# Patient Record
Sex: Female | Born: 1960 | Race: White | Hispanic: No | Marital: Married | Smoking: Never smoker
Health system: Southern US, Community
[De-identification: ages and names within clinical notes are randomized; demographics above are authoritative.]

## PROBLEM LIST (undated history)

## (undated) DIAGNOSIS — C801 Malignant (primary) neoplasm, unspecified: Secondary | ICD-10-CM

## (undated) DIAGNOSIS — R140 Abdominal distension (gaseous): Secondary | ICD-10-CM

## (undated) DIAGNOSIS — E079 Disorder of thyroid, unspecified: Secondary | ICD-10-CM

## (undated) DIAGNOSIS — K5090 Crohn's disease, unspecified, without complications: Secondary | ICD-10-CM

## (undated) DIAGNOSIS — G43909 Migraine, unspecified, not intractable, without status migrainosus: Secondary | ICD-10-CM

## (undated) HISTORY — DX: Crohn's disease, unspecified, without complications: K50.90

## (undated) HISTORY — PX: BREAST SURGERY: SHX581

## (undated) HISTORY — DX: Migraine, unspecified, not intractable, without status migrainosus: G43.909

## (undated) HISTORY — DX: Malignant (primary) neoplasm, unspecified: C80.1

## (undated) HISTORY — DX: Abdominal distension (gaseous): R14.0

## (undated) HISTORY — PX: OTHER SURGICAL HISTORY: SHX169

## (undated) HISTORY — DX: Disorder of thyroid, unspecified: E07.9

## (undated) HISTORY — PX: COLON SURGERY: SHX602

---

## 1992-11-06 HISTORY — PX: THYROIDECTOMY, PARTIAL: SHX18

## 1998-02-06 ENCOUNTER — Ambulatory Visit (HOSPITAL_COMMUNITY): Admission: RE | Admit: 1998-02-06 | Discharge: 1998-02-06 | Payer: Self-pay | Admitting: Gastroenterology

## 1998-12-08 ENCOUNTER — Other Ambulatory Visit: Admission: RE | Admit: 1998-12-08 | Discharge: 1998-12-08 | Payer: Self-pay | Admitting: Obstetrics and Gynecology

## 1999-09-10 ENCOUNTER — Ambulatory Visit (HOSPITAL_COMMUNITY): Admission: RE | Admit: 1999-09-10 | Discharge: 1999-09-10 | Payer: Self-pay | Admitting: Internal Medicine

## 1999-09-12 ENCOUNTER — Encounter (HOSPITAL_BASED_OUTPATIENT_CLINIC_OR_DEPARTMENT_OTHER): Payer: Self-pay | Admitting: Internal Medicine

## 1999-09-12 ENCOUNTER — Ambulatory Visit (HOSPITAL_COMMUNITY): Admission: RE | Admit: 1999-09-12 | Discharge: 1999-09-12 | Payer: Self-pay | Admitting: Internal Medicine

## 2000-10-22 ENCOUNTER — Other Ambulatory Visit: Admission: RE | Admit: 2000-10-22 | Discharge: 2000-10-22 | Payer: Self-pay | Admitting: Obstetrics and Gynecology

## 2001-06-22 ENCOUNTER — Inpatient Hospital Stay (HOSPITAL_COMMUNITY): Admission: AD | Admit: 2001-06-22 | Discharge: 2001-06-24 | Payer: Self-pay | Admitting: Obstetrics and Gynecology

## 2002-03-18 ENCOUNTER — Encounter: Admission: RE | Admit: 2002-03-18 | Discharge: 2002-06-16 | Payer: Self-pay | Admitting: Family Medicine

## 2003-06-29 ENCOUNTER — Other Ambulatory Visit: Admission: RE | Admit: 2003-06-29 | Discharge: 2003-06-29 | Payer: Self-pay | Admitting: Family Medicine

## 2004-09-28 ENCOUNTER — Other Ambulatory Visit: Admission: RE | Admit: 2004-09-28 | Discharge: 2004-09-28 | Payer: Self-pay | Admitting: Obstetrics and Gynecology

## 2006-06-23 ENCOUNTER — Encounter: Admission: RE | Admit: 2006-06-23 | Discharge: 2006-06-23 | Payer: Self-pay | Admitting: Obstetrics and Gynecology

## 2006-07-07 ENCOUNTER — Encounter: Admission: RE | Admit: 2006-07-07 | Discharge: 2006-07-07 | Payer: Self-pay | Admitting: Surgery

## 2006-07-07 ENCOUNTER — Ambulatory Visit (HOSPITAL_BASED_OUTPATIENT_CLINIC_OR_DEPARTMENT_OTHER): Admission: RE | Admit: 2006-07-07 | Discharge: 2006-07-07 | Payer: Self-pay | Admitting: Surgery

## 2006-07-07 ENCOUNTER — Encounter (INDEPENDENT_AMBULATORY_CARE_PROVIDER_SITE_OTHER): Payer: Self-pay | Admitting: *Deleted

## 2006-12-18 ENCOUNTER — Ambulatory Visit: Payer: Self-pay | Admitting: Psychology

## 2007-01-01 ENCOUNTER — Ambulatory Visit: Payer: Self-pay | Admitting: Psychology

## 2007-01-22 ENCOUNTER — Ambulatory Visit: Payer: Self-pay | Admitting: Psychology

## 2007-02-04 ENCOUNTER — Ambulatory Visit: Payer: Self-pay | Admitting: Psychology

## 2007-02-17 ENCOUNTER — Ambulatory Visit: Payer: Self-pay | Admitting: Psychology

## 2007-03-02 ENCOUNTER — Ambulatory Visit: Payer: Self-pay | Admitting: Psychology

## 2007-03-24 ENCOUNTER — Ambulatory Visit: Payer: Self-pay | Admitting: Psychology

## 2007-04-16 ENCOUNTER — Ambulatory Visit: Payer: Self-pay | Admitting: Psychology

## 2007-04-30 ENCOUNTER — Ambulatory Visit: Payer: Self-pay | Admitting: Psychology

## 2007-05-15 ENCOUNTER — Ambulatory Visit: Payer: Self-pay | Admitting: Psychology

## 2007-06-30 ENCOUNTER — Ambulatory Visit: Payer: Self-pay | Admitting: Psychology

## 2007-08-06 ENCOUNTER — Encounter: Admission: RE | Admit: 2007-08-06 | Discharge: 2007-08-06 | Payer: Self-pay | Admitting: Obstetrics and Gynecology

## 2007-08-07 ENCOUNTER — Ambulatory Visit: Payer: Self-pay | Admitting: Psychology

## 2007-09-08 ENCOUNTER — Ambulatory Visit: Payer: Self-pay | Admitting: Psychology

## 2007-10-20 ENCOUNTER — Ambulatory Visit: Payer: Self-pay | Admitting: Psychology

## 2007-11-03 ENCOUNTER — Ambulatory Visit: Payer: Self-pay | Admitting: Psychology

## 2007-12-15 ENCOUNTER — Ambulatory Visit: Payer: Self-pay | Admitting: Psychology

## 2008-01-12 ENCOUNTER — Ambulatory Visit: Payer: Self-pay | Admitting: Psychology

## 2008-02-23 ENCOUNTER — Ambulatory Visit: Payer: Self-pay | Admitting: Psychology

## 2008-05-02 ENCOUNTER — Ambulatory Visit: Payer: Self-pay | Admitting: Psychology

## 2008-05-31 ENCOUNTER — Ambulatory Visit: Payer: Self-pay | Admitting: Psychology

## 2009-01-05 ENCOUNTER — Encounter: Admission: RE | Admit: 2009-01-05 | Discharge: 2009-01-05 | Payer: Self-pay | Admitting: Obstetrics and Gynecology

## 2010-05-29 ENCOUNTER — Other Ambulatory Visit: Payer: Self-pay | Admitting: Dermatology

## 2010-08-24 NOTE — Op Note (Signed)
Jody Chen, DONN NO.:  [id number]   MEDICAL RECORD NO.:  [id number]          PATIENT TYPE:  AMB   LOCATION:  [place]                          FACILITY:  [facility]   PHYSICIAN:  Haywood Lasso, M.D.DATE OF BIRTH:  1960-06-21   DATE OF PROCEDURE:  07/07/2006  DATE OF DISCHARGE:                               OPERATIVE REPORT   [facility] OFFICE MEDICAL NO.:  [id number].   PREOPERATIVE DIAGNOSIS:  Right breast mass.   POSTOPERATIVE DIAGNOSIS:  Right breast mass.   OPERATION:  Needle-guided excision of right breast mass.   SURGEON:  Haywood Lasso, M.D.   ANESTHESIA:  MAC.   CLINICAL HISTORY:  This is a 50 year old lady who recently had a  mammogram that showed a small mass in her right breast.  It was  subareolar and may have been a papilloma inside a dilated milk duct.  It  was not palpable.  After discussion of the alternatives, she wished to  have an excisional biopsy.   DESCRIPTION OF PROCEDURE:  The patient was seen in the holding area and  had no further questions.  The right breast was marked as the operative  side.  I reviewed the needle localized mammogram film, as well as  ultrasound film.  The guidewire entered the breast medially and tracked  laterally, and was about a centimeter below the inferior edge of the  areolar margin.  An X just below the areolar margin marked the skin at  the location of the nodule.   DESCRIPTION OF PROCEDURE:  The patient was taken to the operating room,  and after IV sedation had been obtained the breast was prepped and  draped.  The time-out occurred.   A combination 1% Xylocaine plain and 0.5% Marcaine with epinephrine was  mixed equally and used for local.  Because she has implants, I was  careful to infiltrate all of the skin and subcutaneous tissue -- staying  very superficial.   I made a curvilinear incision at the areolar edge, going from about the  3 o'clock to the 6 o'clock position, since the guidewire  was coming in  fairly medially.  Using some skin hooks, I elevated the skin flap until  I could get over to the guidewire and manipulated the guidewire into the  incision.  I then used the cautery to take a cylinder of tissue around  the guidewire, starting medially and working laterally in the direction  it was moving.  I took only about a centimeter diameter piece of tissue,  since this nodule was fairly small; the guidewire appeared to go  directly through it.  This got a little more medial, the guidewire was  actually tracking a little bit superior; and although the X was a little  bit below the areolar margin, I could identify dilated duct which was  consistent with a nodule we had anticipated.  This was really at about  the 5:30 position at the bed about the areolar edge.  I took this out  intact, and we appeared to have a dilated duct.  A couple of other  ducts  with some fibrocystic type material was noted, but nothing that looked  like DCIS.   This was sent for specimen ultrasound.  I made sure everything was dry  and then infiltrated a little bit more Marcaine local; again, staying  superficial to avoid injury to the implant.   The incision was closed with some 3-0 Vicryl, followed by 4-0 Monocryl  subcuticular and Dermabond.   The patient tolerated the procedure well.  There were no operative  complications.  All counts were correct.  Dr. Sadie Haber reported that the  ultrasound showed that the specimen contained the lesion in question.      Haywood Lasso, M.D.  Electronically Signed     CJS/MEDQ  D:  07/07/2006  T:  07/07/2006  Job:  [id number]   cc:   Suszanne Conners, M.D.

## 2011-01-15 ENCOUNTER — Other Ambulatory Visit: Payer: Self-pay | Admitting: Obstetrics and Gynecology

## 2011-01-18 ENCOUNTER — Other Ambulatory Visit: Payer: Self-pay | Admitting: Obstetrics and Gynecology

## 2011-01-18 DIAGNOSIS — R928 Other abnormal and inconclusive findings on diagnostic imaging of breast: Secondary | ICD-10-CM

## 2011-02-22 ENCOUNTER — Ambulatory Visit
Admission: RE | Admit: 2011-02-22 | Discharge: 2011-02-22 | Disposition: A | Discharge status: Home / Self Care | Payer: 59 | Source: Ambulatory Visit | Attending: Obstetrics and Gynecology | Admitting: Obstetrics and Gynecology

## 2011-02-22 DIAGNOSIS — R928 Other abnormal and inconclusive findings on diagnostic imaging of breast: Secondary | ICD-10-CM

## 2011-02-27 ENCOUNTER — Other Ambulatory Visit: Payer: Self-pay

## 2011-03-28 ENCOUNTER — Other Ambulatory Visit: Payer: Self-pay | Admitting: Gastroenterology

## 2011-03-28 DIAGNOSIS — K5010 Crohn's disease of large intestine without complications: Secondary | ICD-10-CM

## 2011-04-18 ENCOUNTER — Ambulatory Visit
Admission: RE | Admit: 2011-04-18 | Discharge: 2011-04-18 | Disposition: A | Discharge status: Home / Self Care | Payer: 59 | Source: Ambulatory Visit | Attending: Gastroenterology | Admitting: Gastroenterology

## 2011-04-18 DIAGNOSIS — K5010 Crohn's disease of large intestine without complications: Secondary | ICD-10-CM

## 2011-04-18 MED ORDER — IOHEXOL 300 MG/ML  SOLN
100.0000 mL | Freq: Once | INTRAMUSCULAR | Status: AC | PRN
  Administered 2011-04-18: 100 mL via INTRAVENOUS

## 2011-04-23 ENCOUNTER — Other Ambulatory Visit: Payer: Self-pay | Admitting: Dermatology

## 2011-05-01 ENCOUNTER — Telehealth (INDEPENDENT_AMBULATORY_CARE_PROVIDER_SITE_OTHER): Payer: Self-pay

## 2011-05-01 NOTE — Telephone Encounter (Signed)
Pt is aware she has a consult appt with Dr. Barry Dienes on 05/06/11 at 8:30 a.m.  She will contact Dr. Perley Jain office and have H&P and progress notes faxed to our office.

## 2011-05-06 ENCOUNTER — Encounter (INDEPENDENT_AMBULATORY_CARE_PROVIDER_SITE_OTHER): Payer: Self-pay | Admitting: General Surgery

## 2011-05-06 ENCOUNTER — Ambulatory Visit (INDEPENDENT_AMBULATORY_CARE_PROVIDER_SITE_OTHER): Payer: Commercial Managed Care - PPO | Admitting: General Surgery

## 2011-05-06 VITALS — BP 108/68 | HR 66 | Temp 98.2°F | Resp 16 | Ht 69.0 in | Wt 137.2 lb

## 2011-05-06 DIAGNOSIS — R109 Unspecified abdominal pain: Secondary | ICD-10-CM | POA: Insufficient documentation

## 2011-05-06 DIAGNOSIS — K5000 Crohn's disease of small intestine without complications: Secondary | ICD-10-CM | POA: Insufficient documentation

## 2011-05-06 NOTE — Assessment & Plan Note (Signed)
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  These appear to be chronic. I feel she would likely benefit from a ileocolectomy. I advised her to discuss her ability to come off 6-MP with Dr. Watt Climes.    I discussed surgical complications including leak, abscess, fistula, possible need for temporary ostomy, possible hernia, urinary infection, and the possibility of finding additional areas.   She understands and wishes to proceed.    She is undergoing colonoscopy on 2/8.  We will make sure she has no other areas of crohn's that are active in the colon prior to surgery.

## 2011-05-06 NOTE — Progress Notes (Signed)
Chief Complaint  Patient presents with  . Other    Eval Crohn's    HISTORY: Pt is a 51 year old female who presents with a long history of crohn's disease.  She was diagnosed by colonoscopy and biopsy around 10 years ago after she continued to have sharp abdominal pains.  Since that time, she has been on 6-MP, and occasionally on entocort. Now, she has episodes of severe pain, abdominal distention, and difficulty with stools every few months.  It takes several weeks to get over the pain. She requires librax sometimes for these.  She also gets nauseated and has issues eating with the episodes.  She sees Dr. Watt Climes.  He ordered a CT scan which demonstrated 3 areas of stricture in the RLQ/terminal ileum region.  These appear to be chronic.  She is now having issues with her immunosuppression and has had shingles, cutaneous lupus, and condyloma popping up.  She would like to get off the immunosuppression if possible.    Past Medical History  Diagnosis Date  . Thyroid disease   . Cancer     thyroid  . Constipation   . Migraines   . Abdominal distention   . Abdominal pain   . Crohn's     Past Surgical History  Procedure Date  . Thyroidectomy, partial 8/94    Current Outpatient Prescriptions  Medication Sig Dispense Refill  . levothyroxine (SYNTHROID, LEVOTHROID) 175 MCG tablet Take 175 mcg by mouth daily.         No Known Allergies   History reviewed. No pertinent family history.   History   Social History  . Marital Status: Married    Spouse Name: N/A    Number of Children: N/A  . Years of Education: N/A   Social History Main Topics  . Smoking status: Never Smoker   . Smokeless tobacco: Never Used  . Alcohol Use: Yes     1 - 2 glasses weekly  . Drug Use: No  . Sexually Active: None    REVIEW OF SYSTEMS - PERTINENT POSITIVES ONLY: 12 point review of systems negative other than HPI and PMH except for headaches.    EXAM: Filed Vitals:   05/06/11 0851  BP: 108/68    Pulse: 66  Temp: 98.2 F (36.8 C)  Resp: 16    Gen:  No acute distress.  Well nourished and well groomed.   Neurological: Alert and oriented to person, place, and time. Coordination normal.  Head: Normocephalic and atraumatic.  Eyes: Conjunctivae are normal. Pupils are equal, round, and reactive to light. No scleral icterus.  Neck: Normal range of motion. Neck supple. No tracheal deviation or thyromegaly present.  Cardiovascular: Normal rate, regular rhythm, and intact distal pulses.   Respiratory: Effort normal.  No respiratory distress. No chest wall tenderness.  GI: Soft. Bowel sounds are normal. The abdomen is soft and nontender.  There is no rebound and no guarding.  Musculoskeletal: Normal range of motion. Extremities are nontender.  Lymphadenopathy: No cervical, preauricular, postauricular or axillary adenopathy is present Skin: Skin is warm and dry. No rash noted. No diaphoresis. No erythema. No pallor. No clubbing, cyanosis, or edema.   Psychiatric: Normal mood and affect. Behavior is normal. Judgment and thought content normal.    LABORATORY RESULTS: Available labs are reviewed CBC, CMET normal.     RADIOLOGY RESULTS: See E-Chart or I-Site for most recent results.  Images and reports are reviewed  CT 04/18/2011  IMPRESSION:  1. Significantly dilated loops of  small bowel within the distal  and terminal ileum consistent with partial obstruction secondary to  three areas of small-bowel stricture involving the distal and  terminal ileum. No enhancement of these areas of apparent stricture  is seen to indicate reversibility. There is subsequent  fecalization of the distal ileum.  2. Some mucosal enhancement and thickening of the terminal ileum  suggests an active inflammatory process. No abscess is seen.     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  These appear to be chronic. I feel she would likely  benefit from a ileocolectomy. I advised her to discuss her ability to come off 6-MP with Dr. Watt Climes.    I discussed surgical complications including leak, abscess, fistula, possible need for temporary ostomy, possible hernia, urinary infection, and the possibility of finding additional areas.   She understands and wishes to proceed.    She is undergoing colonoscopy on 2/8.  We will make sure she has no other areas of crohn's that are active in the colon prior to surgery.    I advised her to discuss the possibility of stopping the immunosuppression with Dr. Watt Climes.  She is seeing him later this week.    Milus Height MD Surgical Oncology, [facility]   Visit Diagnoses: 1. Crohn's disease of ileum     Primary Care Physician: Jeryl Columbia, MD, MD

## 2011-05-06 NOTE — Patient Instructions (Signed)
Open Colon Resection Colon resection is surgery to take out the end of the small intestine and the first part of the colon.  This is called an ileocolectomy.   LET YOUR CAREGIVER KNOW ABOUT:  Any allergies.   All medicines you are taking, including:   Herbs, eyedrops, over-the-counter medicines and creams.   Blood thinners (anticoagulants), aspirin, or other drugs that could affect blood clotting.   Use of steroids (by mouth or as creams).   Previous problems with anesthetics, including local anesthetics.   Possibility of pregnancy, if this applies.   Any history of blood clots.   Any history of bleeding or other blood problems.   Previous surgery.   Smoking history.   Any recent symptoms of colds or infections.   Other health problems.    RISKS AND COMPLICATIONS  There are always risks for surgery with medicine that makes you sleep (general anesthetic). They include breathing and heart problems. However, this risk is low for people who have no other health problems. Other complications from colon resection may include:  An infection developing in the area where the surgery was done.   Problems with the incisions including:   Bleeding from an incision.   The wound reopening.   Tissues from inside the abdomen bulging through the incision (hernia).   Bleeding inside the abdomen.   Reopening of the colon where it was stitched or stapled together. This is a serious complication. Another procedure may be needed to fix the problem. (leak of connection) If another surgery is required, this may lead to a temporary ostomy where stool goes to the skin and is collected in a bag.    Damage to other organs in the abdomen.   A blood clot forming in a vein and traveling to the lungs.   Future blockage of the colon/small intestine.   BEFORE THE PROCEDURE  A pre op medical evaluation will be done for anesthesia. This may include:   A physical exam.   Blood tests.   A  test to check the heart's rhythm (electrocardiogram).   X-rays, such as magnetic resonance imaging (MRI). This can take pictures of the colon. An MRI uses a magnet, radio waves, and a computer to create a picture of your colon.   Talking with the person who will be in charge of the medicine during the procedure. An open colon resection requires general anesthesia. Ask what you can expect.   Two weeks before the surgery, stop using aspirin and nonsteroidal anti-inflammatory drugs (NSAIDs) for pain relief. This includes prescription drugs and over-the-counter drugs. Also stop taking vitamin E.   If you take blood thinners, ask your caregiver when you should stop taking them.   Do not eat or drink anything for 8 to 12 hours before the surgery. Ask your caregiver if it is okay to take any needed medicines with a sip of water.   Ask your caregiver if you need to arrive early before the procedure.   On the day of your surgery, your caregiver will need to know the last time you had anything to eat or drink. This includes water, gum, and candy.   Make arrangements in advance for someone to drive you home.    PROCEDURE Colon resection can take 1 to 4 hours.  Small monitors will be put on your body. They are used to check your heart, blood pressure, and oxygen level.   You will be given an intravenous line (IV). A needle will be inserted  in your arm. Medicine will be able to flow directly into your body through this needle.   You might be given a medicine to help you relax (sedative).   You will be given a general anesthetic.   A tube may be put in through your nose. It is called a nasogastric tube. It is used to remove stomach juices after surgery until the intestines start working again.   Once you are asleep, the surgeon will make an incision in the abdomen about 6 to 12 inches long.   Clamps are put on both ends of the diseased part of the colon.   The part of the intestine between the  clamps is removed.   If possible, the ends of the healthy colon that remain will be stitched or stapled together.   The incision from the colon resection will be closed with stitches or staples.    AFTER THE PROCEDURE  You will stay in a recovery area until the anesthesia has worn off. Your blood pressure and pulse will be checked every so often. Then you will be taken to a hospital room.   You will continue to get fluids through the IV for awhile. The IV will be taken out when the colon starts working again.   You will gradually go back to a normal diet.   Some pain is normal after a colon resection. Ask for pain medicine if the pain becomes too much.   You will be urged to get up and start walking after 1 or 2 days, at the most.   Most people spend 3 to 7 days in the hospital after this surgery. Ask your caregiver what to expect.

## 2011-05-08 ENCOUNTER — Encounter (INDEPENDENT_AMBULATORY_CARE_PROVIDER_SITE_OTHER): Payer: Self-pay

## 2011-05-17 ENCOUNTER — Other Ambulatory Visit: Payer: Self-pay | Admitting: Gastroenterology

## 2011-05-20 ENCOUNTER — Ambulatory Visit (INDEPENDENT_AMBULATORY_CARE_PROVIDER_SITE_OTHER): Payer: 59 | Admitting: General Surgery

## 2011-05-21 ENCOUNTER — Encounter (INDEPENDENT_AMBULATORY_CARE_PROVIDER_SITE_OTHER): Payer: Self-pay

## 2011-05-28 ENCOUNTER — Ambulatory Visit (INDEPENDENT_AMBULATORY_CARE_PROVIDER_SITE_OTHER): Payer: 59 | Admitting: General Surgery

## 2011-06-26 ENCOUNTER — Encounter (HOSPITAL_COMMUNITY): Admission: RE | Payer: Self-pay | Source: Ambulatory Visit

## 2011-06-26 ENCOUNTER — Inpatient Hospital Stay (HOSPITAL_COMMUNITY): Admission: RE | Admit: 2011-06-26 | Payer: 59 | Source: Ambulatory Visit | Admitting: General Surgery

## 2011-06-26 SURGERY — COLECTOMY, RIGHT, LAPAROSCOPIC
Anesthesia: General

## 2011-07-04 ENCOUNTER — Encounter (INDEPENDENT_AMBULATORY_CARE_PROVIDER_SITE_OTHER): Payer: 59 | Admitting: General Surgery

## 2011-07-08 ENCOUNTER — Encounter (INDEPENDENT_AMBULATORY_CARE_PROVIDER_SITE_OTHER): Payer: 59 | Admitting: General Surgery

## 2012-01-08 ENCOUNTER — Other Ambulatory Visit (HOSPITAL_COMMUNITY): Payer: Self-pay | Admitting: Neurosurgery

## 2012-01-08 ENCOUNTER — Ambulatory Visit (HOSPITAL_COMMUNITY)
Admission: RE | Admit: 2012-01-08 | Discharge: 2012-01-08 | Disposition: A | Discharge status: Home / Self Care | Payer: 59 | Source: Ambulatory Visit | Attending: Neurosurgery | Admitting: Neurosurgery

## 2012-01-08 DIAGNOSIS — M542 Cervicalgia: Secondary | ICD-10-CM

## 2012-01-08 DIAGNOSIS — M5020 Other cervical disc displacement, unspecified cervical region: Secondary | ICD-10-CM | POA: Insufficient documentation

## 2012-10-02 ENCOUNTER — Other Ambulatory Visit: Payer: Self-pay | Admitting: Obstetrics and Gynecology

## 2012-10-22 ENCOUNTER — Other Ambulatory Visit: Payer: Self-pay | Admitting: Dermatology

## 2013-06-17 ENCOUNTER — Other Ambulatory Visit: Payer: Self-pay | Admitting: Dermatology

## 2013-11-11 ENCOUNTER — Other Ambulatory Visit: Payer: Self-pay | Admitting: Obstetrics and Gynecology

## 2013-11-15 LAB — CYTOLOGY - PAP

## 2014-01-10 ENCOUNTER — Other Ambulatory Visit: Payer: Self-pay | Admitting: Dermatology

## 2014-07-06 ENCOUNTER — Other Ambulatory Visit: Payer: Self-pay | Admitting: Dermatology

## 2015-04-11 MED FILL — LIALDA 1.2 GM TABLET SA: 1.2 | 90 days supply | Qty: 360 | Fill #1

## 2015-04-20 ENCOUNTER — Ambulatory Visit (INDEPENDENT_AMBULATORY_CARE_PROVIDER_SITE_OTHER): Payer: 59 | Admitting: Family Medicine

## 2015-04-20 VITALS — BP 110/62 | HR 92 | Temp 98.5°F | Resp 18 | Ht 69.75 in | Wt 134.4 lb

## 2015-04-20 DIAGNOSIS — J029 Acute pharyngitis, unspecified: Secondary | ICD-10-CM | POA: Diagnosis not present

## 2015-04-20 MED ORDER — AMOXICILLIN-POT CLAVULANATE 875-125 MG PO TABS: 1.0000 | ORAL_TABLET | Freq: Two times a day (BID) | ORAL | Status: DC

## 2015-04-20 MED FILL — EEMT HS 0.625-1.25 MG TAB: 0.625-1.25 | 30 days supply | Qty: 30 | Fill #3

## 2015-04-20 MED FILL — MEDROXYPROGESTERONE 2.5 MG: 2.5 | 90 days supply | Qty: 90 | Fill #1

## 2015-04-20 MED FILL — SYNTHROID 175 MCG TABLET: 175 | 90 days supply | Qty: 90 | Fill #1

## 2015-04-20 MED FILL — AMOX-CLAV 875-125 MG TABLET: 875-125 | 10 days supply | Qty: 20 | Fill #0

## 2015-04-20 NOTE — Progress Notes (Signed)
By signing my name below, I, Moises Blood, attest that this documentation has been prepared under the direction and in the presence of Robyn Haber, MD. Electronically Signed: Moises Blood, [place]. 04/20/2015 , 9:40 AM .  Patient was seen in room 7 .   Patient ID: Jody Chen MRN: [id number], DOB: 11/10/1960, 55 y.o. Date of Encounter: 04/20/2015  Primary Physician: Jeryl Columbia, MD  Chief Complaint:  Chief Complaint  Patient presents with  . URI    x3 days, semi-productive cough, loss of voice    HPI:  Jody Chen is a 55 y.o. female who presents to [facility] complaining of URI symptoms that started 3 days ago. She has semi-productive cough with some voice loss. Her chest feels tight and throat feels sore in the morning. She denies any known sick contacts at home. She recently returned from a trip to [place]. She denies fever.   Her husband last check up was cancer free.   Past Medical History  Diagnosis Date  . Thyroid disease   . Cancer ([place])     thyroid  . Constipation   . Migraines   . Abdominal distention   . Abdominal pain   . Crohn's      Home Meds: Prior to Admission medications   Medication Sig Start Date End Date Taking? Authorizing Provider  levothyroxine (SYNTHROID, LEVOTHROID) 175 MCG tablet Take 175 mcg by mouth daily.   Yes Historical Provider, MD  mesalamine (LIALDA) 1.2 g EC tablet Take 1.2 g by mouth daily with breakfast.   Yes Historical Provider, MD    Allergies: No Known Allergies  Social History   Social History  . Marital Status: Married    Spouse Name: N/A  . Number of Children: N/A  . Years of Education: N/A   Occupational History  . Not on file.   Social History Main Topics  . Smoking status: Never Smoker   . Smokeless tobacco: Never Used  . Alcohol Use: Yes     Comment: 1 - 2 glasses weekly  . Drug Use: No  . Sexual Activity: Not on file   Other Topics Concern  . Not on file   Social  History Narrative     Review of Systems: Constitutional: negative for fever, chills, night sweats, weight changes; positive for fatigue  HEENT: negative for vision changes, hearing loss, rhinorrhea, epistaxis, or sinus pressure; positive for congestion, loss of voice, sore throat Cardiovascular: negative for chest pain or palpitations Respiratory: negative for hemoptysis, wheezing, shortness of breath; positive for cough and chest tightness Abdominal: negative for abdominal pain, nausea, vomiting, diarrhea, or constipation Dermatological: negative for rash Neurologic: negative for headache, dizziness, or syncope All other systems reviewed and are otherwise negative with the exception to those above and in the HPI.  Physical Exam: Blood pressure 110/62, pulse 92, temperature 98.5 F (36.9 C), temperature source Oral, resp. rate 18, height 5' 9.75" (1.772 m), weight 134 lb 6.4 oz (60.963 kg), SpO2 98 %., Body mass index is 19.42 kg/(m^2). General: Well developed, well nourished, in no acute distress. Head: Normocephalic, atraumatic, eyes without discharge, sclera non-icteric, nares are without discharge. Bilateral auditory canals clear, TM's are without perforation, pearly grey and translucent with reflective cone of light bilaterally. Throat really red and postnasal drainage Neck: Supple. No thyromegaly. Full ROM. No lymphadenopathy. Lungs: Clear bilaterally to auscultation without wheezes, rales, or rhonchi. Breathing is unlabored. Heart: RRR with S1 S2. No murmurs, rubs, or gallops appreciated. Msk:  Strength and tone normal for age. Extremities/Skin: Warm and dry. No clubbing or cyanosis. No edema. No rashes or suspicious lesions. Neuro: Alert and oriented X 3. Moves all extremities spontaneously. Gait is normal. CNII-XII grossly in tact. Psych:  Responds to questions appropriately with a normal affect.    ASSESSMENT AND PLAN:  55 y.o. year old female with  This chart was scribed in my  presence and reviewed by me personally.    ICD-9-CM ICD-10-CM   1. Acute pharyngitis, unspecified etiology 462 J02.9 amoxicillin-clavulanate (AUGMENTIN) 875-125 MG tablet       Signed, Robyn Haber, MD 04/20/2015 9:40 AM

## 2015-04-26 MED FILL — FLUCONAZOLE 150 MG TABLET: 150 | 1 days supply | Qty: 1 | Fill #0

## 2015-05-10 ENCOUNTER — Other Ambulatory Visit: Payer: Self-pay | Admitting: Family Medicine

## 2015-05-10 DIAGNOSIS — J00 Acute nasopharyngitis [common cold]: Secondary | ICD-10-CM | POA: Diagnosis not present

## 2015-05-10 MED ORDER — AZITHROMYCIN 250 MG PO TABS: ORAL_TABLET | ORAL | Status: DC

## 2015-05-10 MED ORDER — FLUCONAZOLE 150 MG PO TABS: 150.0000 mg | ORAL_TABLET | Freq: Once | ORAL | Status: DC

## 2015-05-10 MED FILL — FLUCONAZOLE 150 MG TABLET: 150 | 1 days supply | Qty: 1 | Fill #0

## 2015-05-10 MED FILL — BENZONATATE 100 MG CAPSULE: 100 | 7 days supply | Qty: 21 | Fill #0

## 2015-05-10 MED FILL — IPRATROPIUM 0.06% SPRAY: 0.06 | 30 days supply | Qty: 15 | Fill #0

## 2015-05-10 MED FILL — AZITHROMYCIN 250 MG TABLET: 250 | 5 days supply | Qty: 6 | Fill #0

## 2015-05-17 DIAGNOSIS — D225 Melanocytic nevi of trunk: Secondary | ICD-10-CM | POA: Diagnosis not present

## 2015-05-17 DIAGNOSIS — Z86018 Personal history of other benign neoplasm: Secondary | ICD-10-CM | POA: Diagnosis not present

## 2015-05-17 DIAGNOSIS — Z85828 Personal history of other malignant neoplasm of skin: Secondary | ICD-10-CM | POA: Diagnosis not present

## 2015-05-17 DIAGNOSIS — L570 Actinic keratosis: Secondary | ICD-10-CM | POA: Diagnosis not present

## 2015-05-17 DIAGNOSIS — D2272 Melanocytic nevi of left lower limb, including hip: Secondary | ICD-10-CM | POA: Diagnosis not present

## 2015-05-17 DIAGNOSIS — D2271 Melanocytic nevi of right lower limb, including hip: Secondary | ICD-10-CM | POA: Diagnosis not present

## 2015-05-17 DIAGNOSIS — Z808 Family history of malignant neoplasm of other organs or systems: Secondary | ICD-10-CM | POA: Diagnosis not present

## 2015-05-17 DIAGNOSIS — D224 Melanocytic nevi of scalp and neck: Secondary | ICD-10-CM | POA: Diagnosis not present

## 2015-05-17 MED FILL — FLUOROURACIL 5% CREAM: 5 | 20 days supply | Qty: 40 | Fill #0

## 2015-05-22 MED FILL — EEMT HS 0.625-1.25 MG TAB: 0.625-1.25 | 30 days supply | Qty: 30 | Fill #4

## 2015-06-22 MED FILL — EEMT HS 0.625-1.25 MG TAB: 0.625-1.25 | 30 days supply | Qty: 30 | Fill #0

## 2015-07-19 MED FILL — LIALDA 1.2 GM TABLET SA: 1.2 | 90 days supply | Qty: 360 | Fill #2

## 2015-07-20 MED FILL — SYNTHROID 175 MCG TABLET: 175 | 90 days supply | Qty: 90 | Fill #2 | Status: TO

## 2015-08-01 DIAGNOSIS — L570 Actinic keratosis: Secondary | ICD-10-CM | POA: Diagnosis not present

## 2015-08-02 DIAGNOSIS — N951 Menopausal and female climacteric states: Secondary | ICD-10-CM | POA: Diagnosis not present

## 2015-08-02 MED FILL — ESTROGEN-METHYLTESTOS H.S.: 0.625-1.25 | 30 days supply | Qty: 30 | Fill #1

## 2015-08-02 MED FILL — PROGESTERONE 100 MG CAPSULE: 100 | 30 days supply | Qty: 30 | Fill #0

## 2015-08-07 MED FILL — ALPRAZolam 0.5 MG TABS: 0.5 | 90 days supply | Qty: 90 | Fill #0

## 2015-08-29 MED FILL — PROGESTERONE 100 MG CAPSULE: 100 | 30 days supply | Qty: 30 | Fill #1

## 2015-08-29 MED FILL — ESTROGEN-METHYLTESTOS H.S.: 0.625-1.25 | 30 days supply | Qty: 30 | Fill #2

## 2015-09-27 DIAGNOSIS — N950 Postmenopausal bleeding: Secondary | ICD-10-CM | POA: Diagnosis not present

## 2015-09-27 DIAGNOSIS — Z01419 Encounter for gynecological examination (general) (routine) without abnormal findings: Secondary | ICD-10-CM | POA: Diagnosis not present

## 2015-10-02 DIAGNOSIS — Z1231 Encounter for screening mammogram for malignant neoplasm of breast: Secondary | ICD-10-CM | POA: Diagnosis not present

## 2015-10-02 MED FILL — PROGESTERONE 100 MG CAPSULE: 100 | 30 days supply | Qty: 30 | Fill #2 | Status: TO

## 2015-10-30 MED FILL — LIALDA 1.2 GM TABLET SA: 1.2 | 90 days supply | Qty: 360 | Fill #3

## 2015-11-07 DIAGNOSIS — M859 Disorder of bone density and structure, unspecified: Secondary | ICD-10-CM | POA: Diagnosis not present

## 2015-11-07 DIAGNOSIS — C73 Malignant neoplasm of thyroid gland: Secondary | ICD-10-CM | POA: Diagnosis not present

## 2015-11-07 DIAGNOSIS — E890 Postprocedural hypothyroidism: Secondary | ICD-10-CM | POA: Diagnosis not present

## 2015-11-08 DIAGNOSIS — J3089 Other allergic rhinitis: Secondary | ICD-10-CM | POA: Diagnosis not present

## 2015-11-08 DIAGNOSIS — J301 Allergic rhinitis due to pollen: Secondary | ICD-10-CM | POA: Diagnosis not present

## 2015-11-08 DIAGNOSIS — J3081 Allergic rhinitis due to animal (cat) (dog) hair and dander: Secondary | ICD-10-CM | POA: Diagnosis not present

## 2015-11-08 MED FILL — MONTELUKAST SOD 10 MG TAB: 10 | 30 days supply | Qty: 30 | Fill #0

## 2015-11-08 MED FILL — DYMISTA NASAL SPRAY: 137-50 | 30 days supply | Qty: 23 | Fill #0

## 2015-11-28 MED FILL — PROGESTERONE 100 MG CAPSULE: 100 | 30 days supply | Qty: 30 | Fill #0

## 2015-11-28 MED FILL — SYNTHROID 175 MCG TABLET: 175 | 90 days supply | Qty: 90 | Fill #0

## 2015-12-04 DIAGNOSIS — H5213 Myopia, bilateral: Secondary | ICD-10-CM | POA: Diagnosis not present

## 2015-12-04 DIAGNOSIS — H524 Presbyopia: Secondary | ICD-10-CM | POA: Diagnosis not present

## 2015-12-14 DIAGNOSIS — D485 Neoplasm of uncertain behavior of skin: Secondary | ICD-10-CM | POA: Diagnosis not present

## 2015-12-14 DIAGNOSIS — C44519 Basal cell carcinoma of skin of other part of trunk: Secondary | ICD-10-CM | POA: Diagnosis not present

## 2015-12-14 MED FILL — MONTELUKAST SOD 10 MG TAB: 10 | 30 days supply | Qty: 30 | Fill #1

## 2015-12-27 DIAGNOSIS — K5090 Crohn's disease, unspecified, without complications: Secondary | ICD-10-CM | POA: Diagnosis not present

## 2016-01-01 MED FILL — PROGESTERONE 100 MG CAPSULE: 100 | 30 days supply | Qty: 30 | Fill #1

## 2016-01-11 MED FILL — MONTELUKAST SOD 10 MG TAB: 10 | 30 days supply | Qty: 30 | Fill #2

## 2016-01-11 MED FILL — DYMISTA NASAL SPRAY: 137-50 | 30 days supply | Qty: 23 | Fill #1

## 2016-01-31 MED FILL — ESTROGEL 0.06% GEL: 0.75 MG/1.2 | 30 days supply | Qty: 50 | Fill #0

## 2016-02-01 MED FILL — PROGESTERONE 100 MG CAPSULE: 100 | 90 days supply | Qty: 90 | Fill #2

## 2016-02-13 DIAGNOSIS — C44519 Basal cell carcinoma of skin of other part of trunk: Secondary | ICD-10-CM | POA: Diagnosis not present

## 2016-02-13 DIAGNOSIS — B078 Other viral warts: Secondary | ICD-10-CM | POA: Diagnosis not present

## 2016-02-13 DIAGNOSIS — L570 Actinic keratosis: Secondary | ICD-10-CM | POA: Diagnosis not present

## 2016-02-13 DIAGNOSIS — Z23 Encounter for immunization: Secondary | ICD-10-CM | POA: Diagnosis not present

## 2016-02-15 MED FILL — MONTELUKAST SOD 10 MG TAB: 10 | 90 days supply | Qty: 90 | Fill #3

## 2016-02-26 MED FILL — SYNTHROID 175 MCG TABLET: 175 | 90 days supply | Qty: 90 | Fill #1

## 2016-03-04 MED FILL — MESALAMINE DR 1.2G TABLET: 1.2 | 90 days supply | Qty: 360 | Fill #0

## 2016-03-14 MED FILL — DYMISTA NASAL SPRAY: 137-50 | 30 days supply | Qty: 23 | Fill #2

## 2016-03-15 DIAGNOSIS — Z23 Encounter for immunization: Secondary | ICD-10-CM | POA: Diagnosis not present

## 2016-03-15 DIAGNOSIS — E890 Postprocedural hypothyroidism: Secondary | ICD-10-CM | POA: Diagnosis not present

## 2016-03-18 ENCOUNTER — Other Ambulatory Visit (HOSPITAL_COMMUNITY): Payer: Self-pay | Admitting: Orthopedic Surgery

## 2016-03-18 DIAGNOSIS — G8929 Other chronic pain: Secondary | ICD-10-CM

## 2016-03-18 DIAGNOSIS — M2241 Chondromalacia patellae, right knee: Secondary | ICD-10-CM | POA: Diagnosis not present

## 2016-03-18 DIAGNOSIS — S83242A Other tear of medial meniscus, current injury, left knee, initial encounter: Secondary | ICD-10-CM | POA: Diagnosis not present

## 2016-03-18 DIAGNOSIS — M2242 Chondromalacia patellae, left knee: Secondary | ICD-10-CM | POA: Diagnosis not present

## 2016-03-18 DIAGNOSIS — M25569 Pain in unspecified knee: Principal | ICD-10-CM

## 2016-03-22 ENCOUNTER — Ambulatory Visit (HOSPITAL_COMMUNITY)
Admission: RE | Admit: 2016-03-22 | Discharge: 2016-03-22 | Disposition: A | Discharge status: Home / Self Care | Payer: 59 | Source: Ambulatory Visit | Attending: Orthopedic Surgery | Admitting: Orthopedic Surgery

## 2016-03-22 ENCOUNTER — Encounter (HOSPITAL_COMMUNITY): Payer: Self-pay | Admitting: Radiology

## 2016-03-22 DIAGNOSIS — M7122 Synovial cyst of popliteal space [Baker], left knee: Secondary | ICD-10-CM | POA: Diagnosis not present

## 2016-03-22 DIAGNOSIS — M25562 Pain in left knee: Secondary | ICD-10-CM | POA: Diagnosis not present

## 2016-03-22 DIAGNOSIS — M94262 Chondromalacia, left knee: Secondary | ICD-10-CM | POA: Diagnosis not present

## 2016-03-22 DIAGNOSIS — G8929 Other chronic pain: Secondary | ICD-10-CM

## 2016-03-22 DIAGNOSIS — M25569 Pain in unspecified knee: Secondary | ICD-10-CM

## 2016-03-22 DIAGNOSIS — M899 Disorder of bone, unspecified: Secondary | ICD-10-CM | POA: Diagnosis not present

## 2016-03-22 DIAGNOSIS — S83249A Other tear of medial meniscus, current injury, unspecified knee, initial encounter: Secondary | ICD-10-CM | POA: Diagnosis present

## 2016-04-05 MED FILL — AZITHROMYCIN 250 MG TABLET: 250 | 5 days supply | Qty: 6 | Fill #0

## 2016-04-10 DIAGNOSIS — M25562 Pain in left knee: Secondary | ICD-10-CM | POA: Diagnosis not present

## 2016-04-10 DIAGNOSIS — M94262 Chondromalacia, left knee: Secondary | ICD-10-CM | POA: Diagnosis not present

## 2016-04-10 DIAGNOSIS — M25561 Pain in right knee: Secondary | ICD-10-CM | POA: Diagnosis not present

## 2016-04-10 DIAGNOSIS — M6281 Muscle weakness (generalized): Secondary | ICD-10-CM | POA: Diagnosis not present

## 2016-04-12 DIAGNOSIS — M94262 Chondromalacia, left knee: Secondary | ICD-10-CM | POA: Diagnosis not present

## 2016-04-12 DIAGNOSIS — M25562 Pain in left knee: Secondary | ICD-10-CM | POA: Diagnosis not present

## 2016-04-12 DIAGNOSIS — M6281 Muscle weakness (generalized): Secondary | ICD-10-CM | POA: Diagnosis not present

## 2016-04-12 DIAGNOSIS — M25561 Pain in right knee: Secondary | ICD-10-CM | POA: Diagnosis not present

## 2016-04-15 DIAGNOSIS — M6281 Muscle weakness (generalized): Secondary | ICD-10-CM | POA: Diagnosis not present

## 2016-04-15 DIAGNOSIS — M25561 Pain in right knee: Secondary | ICD-10-CM | POA: Diagnosis not present

## 2016-04-15 DIAGNOSIS — M25562 Pain in left knee: Secondary | ICD-10-CM | POA: Diagnosis not present

## 2016-04-15 DIAGNOSIS — M94262 Chondromalacia, left knee: Secondary | ICD-10-CM | POA: Diagnosis not present

## 2016-04-15 MED FILL — ESTROGEL 0.06% GEL: 0.75 MG/1.2 | 60 days supply | Qty: 100 | Fill #1

## 2016-04-18 DIAGNOSIS — M94262 Chondromalacia, left knee: Secondary | ICD-10-CM | POA: Diagnosis not present

## 2016-04-18 DIAGNOSIS — M6281 Muscle weakness (generalized): Secondary | ICD-10-CM | POA: Diagnosis not present

## 2016-04-18 DIAGNOSIS — M25562 Pain in left knee: Secondary | ICD-10-CM | POA: Diagnosis not present

## 2016-04-18 DIAGNOSIS — M25561 Pain in right knee: Secondary | ICD-10-CM | POA: Diagnosis not present

## 2016-04-23 DIAGNOSIS — M25562 Pain in left knee: Secondary | ICD-10-CM | POA: Diagnosis not present

## 2016-04-23 DIAGNOSIS — M94262 Chondromalacia, left knee: Secondary | ICD-10-CM | POA: Diagnosis not present

## 2016-04-23 DIAGNOSIS — M6281 Muscle weakness (generalized): Secondary | ICD-10-CM | POA: Diagnosis not present

## 2016-04-23 DIAGNOSIS — M25561 Pain in right knee: Secondary | ICD-10-CM | POA: Diagnosis not present

## 2016-05-08 DIAGNOSIS — M25562 Pain in left knee: Secondary | ICD-10-CM | POA: Diagnosis not present

## 2016-05-08 DIAGNOSIS — M94262 Chondromalacia, left knee: Secondary | ICD-10-CM | POA: Diagnosis not present

## 2016-05-08 DIAGNOSIS — M25561 Pain in right knee: Secondary | ICD-10-CM | POA: Diagnosis not present

## 2016-05-08 DIAGNOSIS — M6281 Muscle weakness (generalized): Secondary | ICD-10-CM | POA: Diagnosis not present

## 2016-05-09 MED FILL — DYMISTA NASAL SPRAY: 137-50 | 30 days supply | Qty: 23 | Fill #3

## 2016-05-09 MED FILL — PROGESTERONE 100 MG CAPSULE: 100 | 90 days supply | Qty: 90 | Fill #3

## 2016-05-21 DIAGNOSIS — D224 Melanocytic nevi of scalp and neck: Secondary | ICD-10-CM | POA: Diagnosis not present

## 2016-05-21 DIAGNOSIS — Z85828 Personal history of other malignant neoplasm of skin: Secondary | ICD-10-CM | POA: Diagnosis not present

## 2016-05-21 DIAGNOSIS — D485 Neoplasm of uncertain behavior of skin: Secondary | ICD-10-CM | POA: Diagnosis not present

## 2016-05-21 DIAGNOSIS — L821 Other seborrheic keratosis: Secondary | ICD-10-CM | POA: Diagnosis not present

## 2016-05-21 DIAGNOSIS — Z23 Encounter for immunization: Secondary | ICD-10-CM | POA: Diagnosis not present

## 2016-05-21 DIAGNOSIS — Z86018 Personal history of other benign neoplasm: Secondary | ICD-10-CM | POA: Diagnosis not present

## 2016-05-21 DIAGNOSIS — B078 Other viral warts: Secondary | ICD-10-CM | POA: Diagnosis not present

## 2016-05-21 DIAGNOSIS — L570 Actinic keratosis: Secondary | ICD-10-CM | POA: Diagnosis not present

## 2016-05-21 DIAGNOSIS — Z808 Family history of malignant neoplasm of other organs or systems: Secondary | ICD-10-CM | POA: Diagnosis not present

## 2016-05-21 DIAGNOSIS — D2271 Melanocytic nevi of right lower limb, including hip: Secondary | ICD-10-CM | POA: Diagnosis not present

## 2016-05-21 DIAGNOSIS — C44329 Squamous cell carcinoma of skin of other parts of face: Secondary | ICD-10-CM | POA: Diagnosis not present

## 2016-05-21 DIAGNOSIS — D225 Melanocytic nevi of trunk: Secondary | ICD-10-CM | POA: Diagnosis not present

## 2016-05-31 MED FILL — SYNTHROID 175 MCG TABLET: 175 | 90 days supply | Qty: 90 | Fill #2

## 2016-06-28 MED FILL — LIALDA 1.2 GM TABLET SA: 1.2 | 90 days supply | Qty: 360 | Fill #0

## 2016-07-15 DIAGNOSIS — J301 Allergic rhinitis due to pollen: Secondary | ICD-10-CM | POA: Diagnosis not present

## 2016-07-15 DIAGNOSIS — J3089 Other allergic rhinitis: Secondary | ICD-10-CM | POA: Diagnosis not present

## 2016-07-15 DIAGNOSIS — J3081 Allergic rhinitis due to animal (cat) (dog) hair and dander: Secondary | ICD-10-CM | POA: Diagnosis not present

## 2016-07-15 MED FILL — DYMISTA NASAL SPRAY: 137-50 | 30 days supply | Qty: 23 | Fill #0

## 2016-07-15 MED FILL — LEVOCETIRIZINE 5 MG TABLET: 5 | 30 days supply | Qty: 30 | Fill #0

## 2016-07-17 DIAGNOSIS — C44329 Squamous cell carcinoma of skin of other parts of face: Secondary | ICD-10-CM | POA: Diagnosis not present

## 2016-07-17 DIAGNOSIS — Z85828 Personal history of other malignant neoplasm of skin: Secondary | ICD-10-CM | POA: Insufficient documentation

## 2016-08-23 MED FILL — GAVILYTE-N SOLUTION: 420 | 1 days supply | Qty: 4000 | Fill #0

## 2016-08-29 DIAGNOSIS — Z8719 Personal history of other diseases of the digestive system: Secondary | ICD-10-CM | POA: Diagnosis not present

## 2016-08-29 DIAGNOSIS — K6389 Other specified diseases of intestine: Secondary | ICD-10-CM | POA: Diagnosis not present

## 2016-08-29 DIAGNOSIS — K5730 Diverticulosis of large intestine without perforation or abscess without bleeding: Secondary | ICD-10-CM | POA: Diagnosis not present

## 2016-08-30 DIAGNOSIS — Z87448 Personal history of other diseases of urinary system: Secondary | ICD-10-CM | POA: Diagnosis not present

## 2016-08-30 DIAGNOSIS — R310 Gross hematuria: Secondary | ICD-10-CM | POA: Diagnosis not present

## 2016-08-30 DIAGNOSIS — R828 Abnormal findings on cytological and histological examination of urine: Secondary | ICD-10-CM | POA: Diagnosis not present

## 2016-09-03 MED FILL — SYNTHROID 175 MCG TABLET: 175 | 90 days supply | Qty: 90 | Fill #3

## 2016-09-03 MED FILL — ESTROGEL 0.06% GEL: 0.75 MG/1.2 | 30 days supply | Qty: 50 | Fill #2

## 2016-09-04 MED FILL — PROGESTERONE 100 MG CAPSULE: 100 | 30 days supply | Qty: 30 | Fill #0

## 2016-10-01 MED FILL — DYMISTA NASAL SPRAY: 137-50 | 30 days supply | Qty: 23 | Fill #1

## 2016-10-01 MED FILL — PROGESTERONE 100 MG CAPSULE: 100 | 90 days supply | Qty: 90 | Fill #1

## 2016-11-04 DIAGNOSIS — Z01419 Encounter for gynecological examination (general) (routine) without abnormal findings: Secondary | ICD-10-CM | POA: Diagnosis not present

## 2016-11-04 DIAGNOSIS — Z1231 Encounter for screening mammogram for malignant neoplasm of breast: Secondary | ICD-10-CM | POA: Diagnosis not present

## 2016-11-04 DIAGNOSIS — Z681 Body mass index (BMI) 19 or less, adult: Secondary | ICD-10-CM | POA: Diagnosis not present

## 2016-12-03 MED FILL — LIALDA 1.2 GM TABLET SA: 1.2 | 90 days supply | Qty: 360 | Fill #1

## 2016-12-03 MED FILL — DYMISTA NASAL SPRAY: 137-50 | 30 days supply | Qty: 23 | Fill #2

## 2016-12-03 MED FILL — SYNTHROID 175 MCG TABLET: 175 | 90 days supply | Qty: 90 | Fill #0

## 2016-12-19 DIAGNOSIS — L570 Actinic keratosis: Secondary | ICD-10-CM | POA: Diagnosis not present

## 2016-12-19 DIAGNOSIS — Z808 Family history of malignant neoplasm of other organs or systems: Secondary | ICD-10-CM | POA: Diagnosis not present

## 2016-12-19 DIAGNOSIS — D2272 Melanocytic nevi of left lower limb, including hip: Secondary | ICD-10-CM | POA: Diagnosis not present

## 2016-12-19 DIAGNOSIS — Z411 Encounter for cosmetic surgery: Secondary | ICD-10-CM | POA: Diagnosis not present

## 2016-12-19 DIAGNOSIS — Z86018 Personal history of other benign neoplasm: Secondary | ICD-10-CM | POA: Diagnosis not present

## 2016-12-19 DIAGNOSIS — Z85828 Personal history of other malignant neoplasm of skin: Secondary | ICD-10-CM | POA: Diagnosis not present

## 2016-12-19 DIAGNOSIS — D2271 Melanocytic nevi of right lower limb, including hip: Secondary | ICD-10-CM | POA: Diagnosis not present

## 2016-12-19 DIAGNOSIS — Z23 Encounter for immunization: Secondary | ICD-10-CM | POA: Diagnosis not present

## 2016-12-19 DIAGNOSIS — D224 Melanocytic nevi of scalp and neck: Secondary | ICD-10-CM | POA: Diagnosis not present

## 2016-12-19 MED FILL — FLUOROURACIL 5% CREAM: 5 | 21 days supply | Qty: 40 | Fill #0

## 2016-12-19 MED FILL — ESTROGEL 0.06% GEL: 0.75 MG/1.2 | 30 days supply | Qty: 50 | Fill #0

## 2016-12-30 MED FILL — LEVOCETIRIZINE 5 MG TABLET: 5 | 90 days supply | Qty: 90 | Fill #1

## 2017-01-08 DIAGNOSIS — E890 Postprocedural hypothyroidism: Secondary | ICD-10-CM | POA: Diagnosis not present

## 2017-01-08 DIAGNOSIS — Z23 Encounter for immunization: Secondary | ICD-10-CM | POA: Diagnosis not present

## 2017-01-08 DIAGNOSIS — C73 Malignant neoplasm of thyroid gland: Secondary | ICD-10-CM | POA: Diagnosis not present

## 2017-01-08 DIAGNOSIS — M859 Disorder of bone density and structure, unspecified: Secondary | ICD-10-CM | POA: Diagnosis not present

## 2017-01-08 MED FILL — PROGESTERONE 100 MG CAPSULE: 100 | 90 days supply | Qty: 90 | Fill #0

## 2017-03-03 MED FILL — SYNTHROID 175 MCG TABLET: 175 | 90 days supply | Qty: 90 | Fill #1

## 2017-03-03 MED FILL — ESTROGEL 0.06% GEL: 0.75 MG/1.2 | 30 days supply | Qty: 50 | Fill #1

## 2017-03-06 MED FILL — ALPRAZolam 0.5 MG TABS: 0.5 | 90 days supply | Qty: 90 | Fill #0

## 2017-04-10 DIAGNOSIS — H524 Presbyopia: Secondary | ICD-10-CM | POA: Diagnosis not present

## 2017-04-14 MED FILL — DYMISTA NASAL SPRAY: 137-50 | 30 days supply | Qty: 23 | Fill #3

## 2017-04-14 MED FILL — PROGESTERONE 100 MG CAPSULE: 100 | 90 days supply | Qty: 90 | Fill #1

## 2017-05-13 MED FILL — ESTROGEL 0.06% GEL: 0.75 MG/1.2 | 30 days supply | Qty: 50 | Fill #2

## 2017-06-03 MED FILL — LIALDA 1.2 GM TABLET SA: 1.2 | 90 days supply | Qty: 360 | Fill #2

## 2017-06-03 MED FILL — ALPRAZolam 0.5 MG TABS: 0.5 | 90 days supply | Qty: 90 | Fill #1

## 2017-06-03 MED FILL — LEVOCETIRIZINE 5 MG TABLET: 5 | 60 days supply | Qty: 60 | Fill #2

## 2017-06-03 MED FILL — DYMISTA NASAL SPRAY: 137-50 | 30 days supply | Qty: 23 | Fill #4

## 2017-06-03 MED FILL — SYNTHROID 175 MCG TABLET: 175 | 90 days supply | Qty: 90 | Fill #2

## 2017-06-18 DIAGNOSIS — D1801 Hemangioma of skin and subcutaneous tissue: Secondary | ICD-10-CM | POA: Diagnosis not present

## 2017-06-18 DIAGNOSIS — L820 Inflamed seborrheic keratosis: Secondary | ICD-10-CM | POA: Diagnosis not present

## 2017-06-18 DIAGNOSIS — D224 Melanocytic nevi of scalp and neck: Secondary | ICD-10-CM | POA: Diagnosis not present

## 2017-06-18 DIAGNOSIS — D2272 Melanocytic nevi of left lower limb, including hip: Secondary | ICD-10-CM | POA: Diagnosis not present

## 2017-06-18 DIAGNOSIS — D225 Melanocytic nevi of trunk: Secondary | ICD-10-CM | POA: Diagnosis not present

## 2017-06-18 DIAGNOSIS — Z85828 Personal history of other malignant neoplasm of skin: Secondary | ICD-10-CM | POA: Diagnosis not present

## 2017-06-18 DIAGNOSIS — D485 Neoplasm of uncertain behavior of skin: Secondary | ICD-10-CM | POA: Diagnosis not present

## 2017-06-18 DIAGNOSIS — D2271 Melanocytic nevi of right lower limb, including hip: Secondary | ICD-10-CM | POA: Diagnosis not present

## 2017-06-18 DIAGNOSIS — Z86018 Personal history of other benign neoplasm: Secondary | ICD-10-CM | POA: Diagnosis not present

## 2017-06-18 DIAGNOSIS — Z808 Family history of malignant neoplasm of other organs or systems: Secondary | ICD-10-CM | POA: Diagnosis not present

## 2017-06-18 MED FILL — FLUOROURACIL 5% CREAM: 5 | 21 days supply | Qty: 40 | Fill #0

## 2017-07-07 MED FILL — DYMISTA NASAL SPRAY: 137-50 | 30 days supply | Qty: 23 | Fill #5

## 2017-07-10 MED FILL — METHYLPREDNISOLONE 4 MG TAB: 4 | 6 days supply | Qty: 21 | Fill #0

## 2017-07-22 MED FILL — ESTROGEL 0.06% GEL: 0.75 MG/1.2 | 30 days supply | Qty: 50 | Fill #3

## 2017-08-05 MED FILL — PROGESTERONE 100 MG CAPSULE: 100 | 90 days supply | Qty: 90 | Fill #2

## 2017-08-14 DIAGNOSIS — J301 Allergic rhinitis due to pollen: Secondary | ICD-10-CM | POA: Diagnosis not present

## 2017-08-14 DIAGNOSIS — H1045 Other chronic allergic conjunctivitis: Secondary | ICD-10-CM | POA: Diagnosis not present

## 2017-08-14 DIAGNOSIS — J3081 Allergic rhinitis due to animal (cat) (dog) hair and dander: Secondary | ICD-10-CM | POA: Diagnosis not present

## 2017-08-14 DIAGNOSIS — J3089 Other allergic rhinitis: Secondary | ICD-10-CM | POA: Diagnosis not present

## 2017-08-14 MED FILL — MONTELUKAST SOD 10 MG TAB: 10 | 30 days supply | Qty: 30 | Fill #0

## 2017-09-10 MED FILL — SYNTHROID 175 MCG TABLET: 175 | 90 days supply | Qty: 90 | Fill #3

## 2017-09-15 ENCOUNTER — Other Ambulatory Visit: Payer: Self-pay | Admitting: Obstetrics and Gynecology

## 2017-09-15 DIAGNOSIS — N6310 Unspecified lump in the right breast, unspecified quadrant: Secondary | ICD-10-CM | POA: Diagnosis not present

## 2017-09-16 ENCOUNTER — Other Ambulatory Visit: Payer: 59

## 2017-09-22 ENCOUNTER — Other Ambulatory Visit: Payer: Self-pay | Admitting: Obstetrics and Gynecology

## 2017-09-22 ENCOUNTER — Ambulatory Visit
Admission: RE | Admit: 2017-09-22 | Discharge: 2017-09-22 | Disposition: A | Discharge status: Home / Self Care | Payer: 59 | Source: Ambulatory Visit | Attending: Obstetrics and Gynecology | Admitting: Obstetrics and Gynecology

## 2017-09-22 DIAGNOSIS — N6310 Unspecified lump in the right breast, unspecified quadrant: Secondary | ICD-10-CM

## 2017-09-22 DIAGNOSIS — R922 Inconclusive mammogram: Secondary | ICD-10-CM | POA: Diagnosis not present

## 2017-09-25 ENCOUNTER — Other Ambulatory Visit: Payer: Self-pay | Admitting: Obstetrics and Gynecology

## 2017-09-25 ENCOUNTER — Ambulatory Visit
Admission: RE | Admit: 2017-09-25 | Discharge: 2017-09-25 | Disposition: A | Discharge status: Home / Self Care | Payer: 59 | Source: Ambulatory Visit | Attending: Obstetrics and Gynecology | Admitting: Obstetrics and Gynecology

## 2017-09-25 DIAGNOSIS — D1809 Hemangioma of other sites: Secondary | ICD-10-CM | POA: Diagnosis not present

## 2017-09-25 DIAGNOSIS — N6310 Unspecified lump in the right breast, unspecified quadrant: Secondary | ICD-10-CM

## 2017-09-25 DIAGNOSIS — N6314 Unspecified lump in the right breast, lower inner quadrant: Secondary | ICD-10-CM | POA: Diagnosis not present

## 2017-09-30 MED FILL — ESTROGEL 0.06% GEL: 0.75 MG/1.2 | 30 days supply | Qty: 50 | Fill #4

## 2017-10-20 MED FILL — MONTELUKAST SOD 10 MG TAB: 10 | 30 days supply | Qty: 30 | Fill #1

## 2017-10-20 MED FILL — DYMISTA NASAL SPRAY: 137-50 | 30 days supply | Qty: 23 | Fill #0

## 2017-11-17 MED FILL — PROGESTERONE 100 MG CAPSULE: 100 | 30 days supply | Qty: 30 | Fill #0

## 2017-11-18 DIAGNOSIS — Z01419 Encounter for gynecological examination (general) (routine) without abnormal findings: Secondary | ICD-10-CM | POA: Diagnosis not present

## 2017-11-18 DIAGNOSIS — Z1239 Encounter for other screening for malignant neoplasm of breast: Secondary | ICD-10-CM | POA: Diagnosis not present

## 2017-11-18 DIAGNOSIS — Z681 Body mass index (BMI) 19 or less, adult: Secondary | ICD-10-CM | POA: Diagnosis not present

## 2017-11-18 MED FILL — ALPRAZolam 0.5 MG TABS: 0.5 | 15 days supply | Qty: 60 | Fill #0

## 2017-12-01 MED FILL — MONTELUKAST SOD 10 MG TAB: 10 | 30 days supply | Qty: 30 | Fill #2

## 2017-12-01 MED FILL — ESTROGEL 0.06% GEL: 0.75 MG/1.2 | 30 days supply | Qty: 50 | Fill #0

## 2017-12-09 DIAGNOSIS — M8580 Other specified disorders of bone density and structure, unspecified site: Secondary | ICD-10-CM | POA: Diagnosis not present

## 2017-12-09 DIAGNOSIS — Z23 Encounter for immunization: Secondary | ICD-10-CM | POA: Diagnosis not present

## 2017-12-09 DIAGNOSIS — Z79890 Hormone replacement therapy (postmenopausal): Secondary | ICD-10-CM | POA: Diagnosis not present

## 2017-12-09 DIAGNOSIS — Z681 Body mass index (BMI) 19 or less, adult: Secondary | ICD-10-CM | POA: Diagnosis not present

## 2017-12-09 DIAGNOSIS — E559 Vitamin D deficiency, unspecified: Secondary | ICD-10-CM | POA: Diagnosis not present

## 2017-12-09 DIAGNOSIS — G43909 Migraine, unspecified, not intractable, without status migrainosus: Secondary | ICD-10-CM | POA: Diagnosis not present

## 2017-12-09 DIAGNOSIS — E039 Hypothyroidism, unspecified: Secondary | ICD-10-CM | POA: Diagnosis not present

## 2017-12-09 MED FILL — SUMAtriptan SUCCINATE 100 M: 100 | 30 days supply | Qty: 8 | Fill #0

## 2017-12-12 MED FILL — DYMISTA NASAL SPRAY: 137-50 | 60 days supply | Qty: 46 | Fill #1

## 2017-12-15 MED FILL — SYNTHROID 175 MCG TABLET: 175 | 90 days supply | Qty: 90 | Fill #0

## 2017-12-15 MED FILL — PROGESTERONE 100 MG CAPSULE: 100 | 90 days supply | Qty: 90 | Fill #0

## 2017-12-18 MED FILL — LIALDA 1.2 GM TABLET SA: 1.2 | 90 days supply | Qty: 360 | Fill #0

## 2017-12-31 DIAGNOSIS — D1801 Hemangioma of skin and subcutaneous tissue: Secondary | ICD-10-CM | POA: Diagnosis not present

## 2017-12-31 DIAGNOSIS — Z808 Family history of malignant neoplasm of other organs or systems: Secondary | ICD-10-CM | POA: Diagnosis not present

## 2017-12-31 DIAGNOSIS — Z86018 Personal history of other benign neoplasm: Secondary | ICD-10-CM | POA: Diagnosis not present

## 2017-12-31 DIAGNOSIS — L570 Actinic keratosis: Secondary | ICD-10-CM | POA: Diagnosis not present

## 2017-12-31 DIAGNOSIS — D225 Melanocytic nevi of trunk: Secondary | ICD-10-CM | POA: Diagnosis not present

## 2017-12-31 DIAGNOSIS — Z85828 Personal history of other malignant neoplasm of skin: Secondary | ICD-10-CM | POA: Diagnosis not present

## 2017-12-31 DIAGNOSIS — D2271 Melanocytic nevi of right lower limb, including hip: Secondary | ICD-10-CM | POA: Diagnosis not present

## 2017-12-31 DIAGNOSIS — D224 Melanocytic nevi of scalp and neck: Secondary | ICD-10-CM | POA: Diagnosis not present

## 2017-12-31 DIAGNOSIS — D2272 Melanocytic nevi of left lower limb, including hip: Secondary | ICD-10-CM | POA: Diagnosis not present

## 2018-01-12 MED FILL — MONTELUKAST SOD 10 MG TAB: 10 | 30 days supply | Qty: 30 | Fill #3

## 2018-02-02 DIAGNOSIS — Z0000 Encounter for general adult medical examination without abnormal findings: Secondary | ICD-10-CM | POA: Diagnosis not present

## 2018-02-05 DIAGNOSIS — L570 Actinic keratosis: Secondary | ICD-10-CM | POA: Diagnosis not present

## 2018-02-05 DIAGNOSIS — B078 Other viral warts: Secondary | ICD-10-CM | POA: Diagnosis not present

## 2018-02-05 DIAGNOSIS — Z23 Encounter for immunization: Secondary | ICD-10-CM | POA: Diagnosis not present

## 2018-02-05 DIAGNOSIS — D485 Neoplasm of uncertain behavior of skin: Secondary | ICD-10-CM | POA: Diagnosis not present

## 2018-02-05 DIAGNOSIS — D2271 Melanocytic nevi of right lower limb, including hip: Secondary | ICD-10-CM | POA: Diagnosis not present

## 2018-02-05 MED FILL — MONTELUKAST SOD 10 MG TAB: 10 | 60 days supply | Qty: 60 | Fill #4

## 2018-02-06 DIAGNOSIS — Z0000 Encounter for general adult medical examination without abnormal findings: Secondary | ICD-10-CM | POA: Diagnosis not present

## 2018-02-06 DIAGNOSIS — Z23 Encounter for immunization: Secondary | ICD-10-CM | POA: Diagnosis not present

## 2018-02-11 DIAGNOSIS — M8580 Other specified disorders of bone density and structure, unspecified site: Secondary | ICD-10-CM | POA: Diagnosis not present

## 2018-02-11 DIAGNOSIS — E890 Postprocedural hypothyroidism: Secondary | ICD-10-CM | POA: Diagnosis not present

## 2018-02-11 DIAGNOSIS — C73 Malignant neoplasm of thyroid gland: Secondary | ICD-10-CM | POA: Diagnosis not present

## 2018-02-11 DIAGNOSIS — E039 Hypothyroidism, unspecified: Secondary | ICD-10-CM | POA: Diagnosis not present

## 2018-02-20 MED FILL — ESTROGEL 0.06% GEL: 0.75 MG/1.2 | 30 days supply | Qty: 50 | Fill #1

## 2018-03-02 MED FILL — SUMAtriptan SUCCINATE 100 M: 100 | 30 days supply | Qty: 8 | Fill #1

## 2018-03-09 DIAGNOSIS — H9201 Otalgia, right ear: Secondary | ICD-10-CM | POA: Diagnosis not present

## 2018-03-09 DIAGNOSIS — H9311 Tinnitus, right ear: Secondary | ICD-10-CM | POA: Diagnosis not present

## 2018-03-09 DIAGNOSIS — H6521 Chronic serous otitis media, right ear: Secondary | ICD-10-CM | POA: Diagnosis not present

## 2018-03-09 DIAGNOSIS — Z23 Encounter for immunization: Secondary | ICD-10-CM | POA: Diagnosis not present

## 2018-03-09 DIAGNOSIS — Z681 Body mass index (BMI) 19 or less, adult: Secondary | ICD-10-CM | POA: Diagnosis not present

## 2018-03-09 MED FILL — DYMISTA NASAL SPRAY: 137-50 | 30 days supply | Qty: 23 | Fill #0

## 2018-03-12 MED FILL — SYNTHROID 175 MCG TABLET: 175 | 90 days supply | Qty: 90 | Fill #1

## 2018-03-23 MED FILL — PROGESTERONE 100 MG CAPSULE: 100 | 90 days supply | Qty: 90 | Fill #0

## 2018-04-13 DIAGNOSIS — H903 Sensorineural hearing loss, bilateral: Secondary | ICD-10-CM | POA: Diagnosis not present

## 2018-05-08 MED FILL — ESTROGEL 0.06% GEL: 0.75 MG/1.2 | 30 days supply | Qty: 50 | Fill #2

## 2018-05-08 MED FILL — ALPRAZolam 0.5 MG TABS: 0.5 | 15 days supply | Qty: 60 | Fill #1

## 2018-05-20 MED FILL — LIALDA 1.2 GM TABLET SA: 1.2 | 30 days supply | Qty: 120 | Fill #0

## 2018-05-22 DIAGNOSIS — K5090 Crohn's disease, unspecified, without complications: Secondary | ICD-10-CM | POA: Diagnosis not present

## 2018-05-22 DIAGNOSIS — R1010 Upper abdominal pain, unspecified: Secondary | ICD-10-CM | POA: Diagnosis not present

## 2018-05-25 ENCOUNTER — Other Ambulatory Visit: Payer: Self-pay | Admitting: Physician Assistant

## 2018-05-25 DIAGNOSIS — R1010 Upper abdominal pain, unspecified: Secondary | ICD-10-CM

## 2018-05-26 DIAGNOSIS — D485 Neoplasm of uncertain behavior of skin: Secondary | ICD-10-CM | POA: Diagnosis not present

## 2018-05-26 DIAGNOSIS — L905 Scar conditions and fibrosis of skin: Secondary | ICD-10-CM | POA: Diagnosis not present

## 2018-05-29 ENCOUNTER — Other Ambulatory Visit: Payer: 59

## 2018-06-03 ENCOUNTER — Ambulatory Visit
Admission: RE | Admit: 2018-06-03 | Discharge: 2018-06-03 | Disposition: A | Discharge status: Home / Self Care | Payer: 59 | Source: Ambulatory Visit | Attending: Physician Assistant | Admitting: Physician Assistant

## 2018-06-03 DIAGNOSIS — R1013 Epigastric pain: Secondary | ICD-10-CM | POA: Diagnosis not present

## 2018-06-03 DIAGNOSIS — R1010 Upper abdominal pain, unspecified: Secondary | ICD-10-CM

## 2018-06-11 MED FILL — SYNTHROID 175 MCG TABLET: 175 | 90 days supply | Qty: 90 | Fill #2

## 2018-06-22 MED FILL — DYMISTA NASAL SPRAY: 137-50 | 30 days supply | Qty: 23 | Fill #1

## 2018-06-22 MED FILL — PROGESTERONE 100 MG CAPSULE: 100 | 90 days supply | Qty: 90 | Fill #0

## 2018-07-27 MED FILL — LIALDA 1.2 GM TABLET SA: 1.2 | 30 days supply | Qty: 120 | Fill #0

## 2018-07-29 MED FILL — ESTROGEL 0.06% GEL: 0.75 MG/1.2 | 30 days supply | Qty: 50 | Fill #3

## 2018-08-03 MED FILL — SUMATRIPTAN SUCC 100 MG TAB: 100 | 30 days supply | Qty: 8 | Fill #2

## 2018-08-12 DIAGNOSIS — J3089 Other allergic rhinitis: Secondary | ICD-10-CM | POA: Diagnosis not present

## 2018-08-12 DIAGNOSIS — R05 Cough: Secondary | ICD-10-CM | POA: Diagnosis not present

## 2018-08-12 DIAGNOSIS — J3081 Allergic rhinitis due to animal (cat) (dog) hair and dander: Secondary | ICD-10-CM | POA: Diagnosis not present

## 2018-08-12 DIAGNOSIS — J301 Allergic rhinitis due to pollen: Secondary | ICD-10-CM | POA: Diagnosis not present

## 2018-08-12 MED FILL — PROAIR RESPICLICK INHAL PWD: 108 (90 BAS | 30 days supply | Qty: 1 | Fill #0

## 2018-08-12 MED FILL — AZELASTINE HCL 0.05 % SOLN: 0.05 | 30 days supply | Qty: 6 | Fill #0

## 2018-09-07 DIAGNOSIS — D2271 Melanocytic nevi of right lower limb, including hip: Secondary | ICD-10-CM | POA: Diagnosis not present

## 2018-09-07 DIAGNOSIS — R202 Paresthesia of skin: Secondary | ICD-10-CM | POA: Diagnosis not present

## 2018-09-07 DIAGNOSIS — L570 Actinic keratosis: Secondary | ICD-10-CM | POA: Diagnosis not present

## 2018-09-07 DIAGNOSIS — D2272 Melanocytic nevi of left lower limb, including hip: Secondary | ICD-10-CM | POA: Diagnosis not present

## 2018-09-07 DIAGNOSIS — D225 Melanocytic nevi of trunk: Secondary | ICD-10-CM | POA: Diagnosis not present

## 2018-09-07 DIAGNOSIS — Z85828 Personal history of other malignant neoplasm of skin: Secondary | ICD-10-CM | POA: Diagnosis not present

## 2018-09-07 DIAGNOSIS — B078 Other viral warts: Secondary | ICD-10-CM | POA: Diagnosis not present

## 2018-09-07 DIAGNOSIS — Z86018 Personal history of other benign neoplasm: Secondary | ICD-10-CM | POA: Diagnosis not present

## 2018-09-09 DIAGNOSIS — Z23 Encounter for immunization: Secondary | ICD-10-CM | POA: Diagnosis not present

## 2018-09-09 MED FILL — SYNTHROID 175 MCG TABLET: 175 | 90 days supply | Qty: 90 | Fill #3

## 2018-10-02 MED FILL — AZELASTINE-FLUTICASONE 137-: 137-50 | 30 days supply | Qty: 23 | Fill #0

## 2018-10-18 DIAGNOSIS — Z20828 Contact with and (suspected) exposure to other viral communicable diseases: Secondary | ICD-10-CM | POA: Diagnosis not present

## 2018-10-22 MED FILL — PROGESTERONE 100 MG CAPSULE: 100 | 90 days supply | Qty: 90 | Fill #0

## 2018-10-28 MED FILL — ESTROGEL 0.06% GEL: 0.75 MG/1.2 | 30 days supply | Qty: 50 | Fill #4

## 2018-12-02 MED FILL — AZELASTINE-FLUTICASONE 137-: 137-50 | 30 days supply | Qty: 23 | Fill #1

## 2018-12-08 DIAGNOSIS — Z01419 Encounter for gynecological examination (general) (routine) without abnormal findings: Secondary | ICD-10-CM | POA: Diagnosis not present

## 2018-12-08 DIAGNOSIS — Z681 Body mass index (BMI) 19 or less, adult: Secondary | ICD-10-CM | POA: Diagnosis not present

## 2018-12-08 MED FILL — ALPRAZolam 0.5 MG TABS: 0.5 | 20 days supply | Qty: 60 | Fill #0

## 2018-12-08 MED FILL — ESTROGEL 0.06% GEL: 0.75 MG/1.2 | 30 days supply | Qty: 50 | Fill #0

## 2018-12-08 MED FILL — SUMATRIPTAN SUCC 100 MG TAB: 100 | 30 days supply | Qty: 9 | Fill #0

## 2018-12-11 DIAGNOSIS — Z1231 Encounter for screening mammogram for malignant neoplasm of breast: Secondary | ICD-10-CM | POA: Diagnosis not present

## 2018-12-11 MED FILL — SYNTHROID 175 MCG TABLET: 175 | 90 days supply | Qty: 90 | Fill #0

## 2019-02-01 MED FILL — AZELASTINE-FLUTICASONE 137-: 137-50 | 30 days supply | Qty: 23 | Fill #2

## 2019-02-02 MED FILL — PROGESTERONE 100 MG CAPSULE: 100 | 90 days supply | Qty: 90 | Fill #0

## 2019-02-17 DIAGNOSIS — E890 Postprocedural hypothyroidism: Secondary | ICD-10-CM | POA: Diagnosis not present

## 2019-02-17 DIAGNOSIS — C73 Malignant neoplasm of thyroid gland: Secondary | ICD-10-CM | POA: Diagnosis not present

## 2019-02-17 DIAGNOSIS — Z23 Encounter for immunization: Secondary | ICD-10-CM | POA: Diagnosis not present

## 2019-02-17 DIAGNOSIS — M8580 Other specified disorders of bone density and structure, unspecified site: Secondary | ICD-10-CM | POA: Diagnosis not present

## 2019-02-17 DIAGNOSIS — L659 Nonscarring hair loss, unspecified: Secondary | ICD-10-CM | POA: Diagnosis not present

## 2019-02-17 DIAGNOSIS — Z13220 Encounter for screening for lipoid disorders: Secondary | ICD-10-CM | POA: Diagnosis not present

## 2019-02-17 DIAGNOSIS — R634 Abnormal weight loss: Secondary | ICD-10-CM | POA: Diagnosis not present

## 2019-02-18 DIAGNOSIS — C73 Malignant neoplasm of thyroid gland: Secondary | ICD-10-CM | POA: Diagnosis not present

## 2019-02-18 DIAGNOSIS — R634 Abnormal weight loss: Secondary | ICD-10-CM | POA: Diagnosis not present

## 2019-02-18 DIAGNOSIS — M8580 Other specified disorders of bone density and structure, unspecified site: Secondary | ICD-10-CM | POA: Diagnosis not present

## 2019-02-18 DIAGNOSIS — L659 Nonscarring hair loss, unspecified: Secondary | ICD-10-CM | POA: Diagnosis not present

## 2019-02-18 DIAGNOSIS — E890 Postprocedural hypothyroidism: Secondary | ICD-10-CM | POA: Diagnosis not present

## 2019-02-18 DIAGNOSIS — Z13220 Encounter for screening for lipoid disorders: Secondary | ICD-10-CM | POA: Diagnosis not present

## 2019-02-22 MED FILL — SUMATRIPTAN SUCC 100 MG TAB: 100 | 30 days supply | Qty: 9 | Fill #1

## 2019-02-22 MED FILL — SYNTHROID 200 MCG TABLET: 200 | 30 days supply | Qty: 30 | Fill #0

## 2019-02-23 MED FILL — ESTROGEL 0.06% GEL: 0.75 MG/1.2 | 30 days supply | Qty: 50 | Fill #1

## 2019-03-03 ENCOUNTER — Encounter: Payer: Self-pay | Admitting: Podiatry

## 2019-03-03 ENCOUNTER — Ambulatory Visit (INDEPENDENT_AMBULATORY_CARE_PROVIDER_SITE_OTHER): Payer: 59 | Admitting: Podiatry

## 2019-03-03 ENCOUNTER — Other Ambulatory Visit: Payer: Self-pay

## 2019-03-03 ENCOUNTER — Ambulatory Visit (INDEPENDENT_AMBULATORY_CARE_PROVIDER_SITE_OTHER): Payer: 59

## 2019-03-03 DIAGNOSIS — M2012 Hallux valgus (acquired), left foot: Secondary | ICD-10-CM

## 2019-03-03 DIAGNOSIS — R6882 Decreased libido: Secondary | ICD-10-CM | POA: Insufficient documentation

## 2019-03-03 DIAGNOSIS — M2011 Hallux valgus (acquired), right foot: Secondary | ICD-10-CM | POA: Diagnosis not present

## 2019-03-03 DIAGNOSIS — Z780 Asymptomatic menopausal state: Secondary | ICD-10-CM | POA: Insufficient documentation

## 2019-03-03 DIAGNOSIS — F419 Anxiety disorder, unspecified: Secondary | ICD-10-CM | POA: Insufficient documentation

## 2019-03-03 DIAGNOSIS — N390 Urinary tract infection, site not specified: Secondary | ICD-10-CM | POA: Insufficient documentation

## 2019-03-03 DIAGNOSIS — G43909 Migraine, unspecified, not intractable, without status migrainosus: Secondary | ICD-10-CM | POA: Insufficient documentation

## 2019-03-03 DIAGNOSIS — G4700 Insomnia, unspecified: Secondary | ICD-10-CM | POA: Insufficient documentation

## 2019-03-03 NOTE — Patient Instructions (Signed)
Bunion  A bunion is a bump on the base of the big toe that forms when the bones of the big toe joint move out of position. Bunions may be small at first, but they often get larger over time. They can make walking painful. What are the causes? A bunion may be caused by:  Wearing narrow or pointed shoes that force the big toe to press against the other toes.  Abnormal foot development that causes the foot to roll inward (pronate).  Changes in the foot that are caused by certain diseases, such as rheumatoid arthritis or polio.  A foot injury. What increases the risk? The following factors may make you more likely to develop this condition:  Wearing shoes that squeeze the toes together.  Having certain diseases, such as: ? Rheumatoid arthritis. ? Polio. ? Cerebral palsy.  Having family members who have bunions.  Being born with a foot deformity, such as flat feet or low arches.  Doing activities that put a lot of pressure on the feet, such as ballet dancing. What are the signs or symptoms? The main symptom of a bunion is a noticeable bump on the big toe. Other symptoms may include:  Pain.  Swelling around the big toe.  Redness and inflammation.  Thick or hardened skin on the big toe or between the toes.  Stiffness or loss of motion in the big toe.  Trouble with walking. How is this diagnosed? A bunion may be diagnosed based on your symptoms, medical history, and activities. You may have tests, such as:  X-rays. These allow your health care provider to check the position of the bones in your foot and look for damage to your joint. They also help your health care provider determine the severity of your bunion and the best way to treat it.  Joint aspiration. In this test, a sample of fluid is removed from the toe joint. This test may be done if you are in a lot of pain. It helps rule out diseases that cause painful swelling of the joints, such as arthritis. How is this  treated? Treatment depends on the severity of your symptoms. The goal of treatment is to relieve symptoms and prevent the bunion from getting worse. Your health care provider may recommend:  Wearing shoes that have a wide toe box.  Using bunion pads to cushion the affected area.  Taping your toes together to keep them in a normal position.  Placing a device inside your shoe (orthotics) to help reduce pressure on your toe joint.  Taking medicine to ease pain, inflammation, and swelling.  Applying heat or ice to the affected area.  Doing stretching exercises.  Surgery to remove scar tissue and move the toes back into their normal position. This treatment is rare. Follow these instructions at home: Managing pain, stiffness, and swelling   If directed, put ice on the painful area: ? Put ice in a plastic bag. ? Place a towel between your skin and the bag. ? Leave the ice on for 20 minutes, 2-3 times a day. Activity   If directed, apply heat to the affected area before you exercise. Use the heat source that your health care provider recommends, such as a moist heat pack or a heating pad. ? Place a towel between your skin and the heat source. ? Leave the heat on for 20-30 minutes. ? Remove the heat if your skin turns bright red. This is especially important if you are unable to feel pain,   heat, or cold. You may have a greater risk of getting burned.  Do exercises as told by your health care provider. General instructions  Support your toe joint with proper footwear, shoe padding, or taping as told by your health care provider.  Take over-the-counter and prescription medicines only as told by your health care provider.  Keep all follow-up visits as told by your health care provider. This is important. Contact a health care provider if your symptoms:  Get worse.  Do not improve in 2 weeks. Get help right away if you have:  Severe pain and trouble with walking. Summary  A  bunion is a bump on the base of the big toe that forms when the bones of the big toe joint move out of position.  Bunions can make walking painful.  Treatment depends on the severity of your symptoms.  Support your toe joint with proper footwear, shoe padding, or taping as told by your health care provider. This information is not intended to replace advice given to you by your health care provider. Make sure you discuss any questions you have with your health care provider. Document Released: 03/25/2005 Document Revised: 09/29/2017 Document Reviewed: 08/05/2017 Elsevier Patient Education  2020 [employer]  

## 2019-03-06 ENCOUNTER — Encounter: Payer: Self-pay | Admitting: Podiatry

## 2019-03-06 NOTE — Progress Notes (Signed)
Subjective:  Patient ID: Jody Chen, female    DOB: 14-Mar-1961,  MRN: [id number]  No chief complaint on file.   58 y.o. female presents with the above complaint.  Patient presents with painful bilateral bunion right worse than left.  She states that the pain is worse when ambulating with sneakers.  She has not tried any conservative care to the right bunion deformity.  She is here today for evaluation of the right bunion and various treatment options available to treat it.  Review of Systems: Negative except as noted in the HPI. Denies N/V/F/Ch.  Past Medical History:  Diagnosis Date  . Abdominal distention   . Abdominal pain   . Cancer ([place])    thyroid  . Constipation   . Crohn's   . Migraines   . Thyroid disease     Current Outpatient Medications:  .  Azelastine-Fluticasone 137-50 MCG/ACT SUSP, azelastine-fluticasone 137 mcg-50 mcg/spray nasal spray, Disp: , Rfl:  .  levocetirizine (XYZAL) 5 MG tablet, , Disp: , Rfl:  .  acyclovir (ZOVIRAX) 200 MG capsule, acyclovir 200 mg capsule, Disp: , Rfl:  .  albuterol (VENTOLIN HFA) 108 (90 Base) MCG/ACT inhaler, Ventolin HFA 90 mcg/actuation aerosol inhaler, Disp: , Rfl:  .  ALPRAZolam (XANAX) 0.5 MG tablet, alprazolam 0.5 mg tablet, Disp: , Rfl:  .  alprazolam (XANAX) 2 MG tablet, alprazolam 2 mg tablet  TAKE 1/2 TABLET ORALLY EVERY 8 HOURS AS NEEDED FOR ANXIETY, Disp: , Rfl:  .  azithromycin (ZITHROMAX) 250 MG tablet, Take 2 tabs PO x 1 dose, then 1 tab PO QD x 4 days, Disp: 6 tablet, Rfl: 0 .  cyclobenzaprine (FLEXERIL) 10 MG tablet, cyclobenzaprine 10 mg tablet  Take 1 tablet 3 times a day by oral route as needed., Disp: , Rfl:  .  diazepam (VALIUM) 10 MG tablet, diazepam 10 mg tablet  TAKE 1 TABLET BY MOUTH AT BEDTIME AS NEEDED FOR INSOMNIA, Disp: , Rfl:  .  doxycycline (VIBRA-TABS) 100 MG tablet, doxycycline hyclate 100 mg tablet, Disp: , Rfl:  .  Estradiol (ESTROGEL) 0.75 MG/1.25 GM (0.06%) topical gel, EstroGel 1.25  gram/actuation (0.06%) transdermal gel pump, Disp: , Rfl:  .  fluconazole (DIFLUCAN) 150 MG tablet, Take 1 tablet (150 mg total) by mouth once., Disp: 1 tablet, Rfl: 0 .  hydroxychloroquine (PLAQUENIL) 200 MG tablet, hydroxychloroquine 200 mg tablet, Disp: , Rfl:  .  imiquimod (ALDARA) 5 % cream, imiquimod 5 % topical cream packet, Disp: , Rfl:  .  levothyroxine (SYNTHROID, LEVOTHROID) 175 MCG tablet, Take 175 mcg by mouth daily., Disp: , Rfl:  .  medroxyPROGESTERone (PROVERA) 5 MG tablet, medroxyprogesterone 5 mg tablet  TAKE 1 TABLET BY MOUTH ONCE DAILY, Disp: , Rfl:  .  meloxicam (MOBIC) 15 MG tablet, Mobic 15 mg tablet  Take 1 tablet every day by oral route., Disp: , Rfl:  .  mercaptopurine (PURINETHOL) 50 MG tablet, mercaptopurine 50 mg tablet, Disp: , Rfl:  .  mesalamine (LIALDA) 1.2 g EC tablet, Take 1.2 g by mouth daily with breakfast., Disp: , Rfl:  .  metroNIDAZOLE (FLAGYL) 500 MG tablet, metronidazole 500 mg tablet  TK 1 T PO  BID, Disp: , Rfl:  .  montelukast (SINGULAIR) 10 MG tablet, montelukast 10 mg tablet, Disp: , Rfl:  .  rifaximin (XIFAXAN) 550 MG TABS tablet, Xifaxan 550 mg tablet, Disp: , Rfl:  .  sulfacetamide-prednisolone (BLEPHAMIDE) 10-0.2 % ophthalmic suspension, Blephamide 10 %-0.2 % eye drops,suspension  USE AS DIRECTED, Disp: ,  Rfl:  .  SUMAtriptan (IMITREX) 50 MG tablet, Imitrex 50 mg tablet  Take 1 tablet every 6 hours by oral route as needed for headache., Disp: , Rfl:  .  testosterone cypionate (DEPO-TESTOSTERONE) 200 MG/ML injection, Depo-Testosterone 200 mg/mL intramuscular oil  Inject 0.5 mL every 2 months by intramuscular route., Disp: , Rfl:  .  valACYclovir (VALTREX) 1000 MG tablet, valacyclovir 1 gram tablet, Disp: , Rfl:  .  zolpidem (AMBIEN) 10 MG tablet, Ambien 10 mg tablet  Take 1 tablet by oral route at bedtime., Disp: , Rfl:   Social History   Tobacco Use  Smoking Status Never Smoker  Smokeless Tobacco Never Used    Allergies  Allergen Reactions   . Butorphanol   . Sulfa Antibiotics    Objective:  There were no vitals filed for this visit. There is no height or weight on file to calculate BMI. Constitutional Well developed. Well nourished.  Vascular Dorsalis pedis pulses palpable bilaterally. Posterior tibial pulses palpable bilaterally. Capillary refill normal to all digits.  No cyanosis or clubbing noted. Pedal hair growth normal.  Neurologic Normal speech. Oriented to person, place, and time. Epicritic sensation to light touch grossly present bilaterally.  Dermatologic Nails well groomed and normal in appearance. No open wounds. No skin lesions.  Orthopedic: Normal joint ROM without pain or crepitus bilaterally. Hallux abductovalgus deformity present Left 1st MPJ diminished range of motion. Left 1st TMT without gross hypermobility. Right 1st MPJ diminished range of motion  Right 1st TMT without gross hypermobility. Lesser digital contractures absent bilaterally.   Radiographs: Taken and reviewed. Hallux abductovalgus deformity present. Metatarsal parabola normal. 1st/2nd IMA: Moderate; TSP: 6  Assessment:   1. Valgus deformity of both great toes    Plan:  Patient was evaluated and treated and all questions answered.  Hallux abductovalgus deformity right greater than left -Patient was explained the etiology and various treatment options available for treating right hallux valgus deformity.  Patient was given all the conservative options including orthotics management.  Patient has elected to hold off on any interventions at this time.    No follow-ups on file.

## 2019-03-17 MED FILL — PROGESTERONE 100 MG CAPSULE: 100 | 30 days supply | Qty: 30 | Fill #1

## 2019-03-18 MED FILL — SYNTHROID 200 MCG TABLET: 200 | 90 days supply | Qty: 90 | Fill #1

## 2019-04-14 MED FILL — SUMATRIPTAN SUCC 100 MG TAB: 100 | 30 days supply | Qty: 9 | Fill #2

## 2019-04-15 ENCOUNTER — Other Ambulatory Visit: Payer: Self-pay

## 2019-04-15 ENCOUNTER — Ambulatory Visit (INDEPENDENT_AMBULATORY_CARE_PROVIDER_SITE_OTHER): Payer: 59 | Admitting: Podiatry

## 2019-04-15 ENCOUNTER — Other Ambulatory Visit: Payer: Self-pay | Admitting: *Deleted

## 2019-04-15 ENCOUNTER — Ambulatory Visit (INDEPENDENT_AMBULATORY_CARE_PROVIDER_SITE_OTHER): Payer: 59

## 2019-04-15 ENCOUNTER — Encounter: Payer: Self-pay | Admitting: Podiatry

## 2019-04-15 VITALS — BP 111/69 | HR 77 | Resp 16

## 2019-04-15 DIAGNOSIS — M2012 Hallux valgus (acquired), left foot: Secondary | ICD-10-CM

## 2019-04-15 DIAGNOSIS — M2011 Hallux valgus (acquired), right foot: Secondary | ICD-10-CM

## 2019-04-15 DIAGNOSIS — Z01818 Encounter for other preprocedural examination: Secondary | ICD-10-CM

## 2019-04-15 NOTE — Patient Instructions (Signed)
Pre-Operative Instructions  Congratulations, you have decided to take an important step towards improving your quality of life.  You can be assured that the doctors and staff at [facility] will be with you every step of the way.  Here are some important things you should know:  1. Plan to be at the surgery center/hospital at least 1 (one) hour prior to your scheduled time, unless otherwise directed by the surgical center/hospital staff.  You must have a responsible adult accompany you, remain during the surgery and drive you home.  Make sure you have directions to the surgical center/hospital to ensure you arrive on time. 2. If you are having surgery at [facility] or [facility] hospitals, you will need a copy of your medical history and physical form from your family physician within one month prior to the date of surgery. We will give you a form for your primary physician to complete.  3. We make every effort to accommodate the date you request for surgery.  However, there are times where surgery dates or times have to be moved.  We will contact you as soon as possible if a change in schedule is required.   4. No aspirin/ibuprofen for one week before surgery.  If you are on aspirin, any non-steroidal anti-inflammatory medications (Mobic, Aleve, Ibuprofen) should not be taken seven (7) days prior to your surgery.  You make take Tylenol for pain prior to surgery.  5. Medications - If you are taking daily heart and blood pressure medications, seizure, reflux, allergy, asthma, anxiety, pain or diabetes medications, make sure you notify the surgery center/hospital before the day of surgery so they can tell you which medications you should take or avoid the day of surgery. 6. No food or drink after midnight the night before surgery unless directed otherwise by surgical center/hospital staff. 7. No alcoholic beverages 24-hours prior to surgery.  No smoking 24-hours prior or 24-hours after  surgery. 8. Wear loose pants or shorts. They should be loose enough to fit over bandages, boots, and casts. 9. Don't wear slip-on shoes. Sneakers are preferred. 10. Bring your boot with you to the surgery center/hospital.  Also bring crutches or a walker if your physician has prescribed it for you.  If you do not have this equipment, it will be provided for you after surgery. 11. If you have not been contacted by the surgery center/hospital by the day before your surgery, call to confirm the date and time of your surgery. 12. Leave-time from work may vary depending on the type of surgery you have.  Appropriate arrangements should be made prior to surgery with your employer. 13. Prescriptions will be provided immediately following surgery by your doctor.  Fill these as soon as possible after surgery and take the medication as directed. Pain medications will not be refilled on weekends and must be approved by the doctor. 14. Remove nail polish on the operative foot and avoid getting pedicures prior to surgery. 15. Wash the night before surgery.  The night before surgery wash the foot and leg well with water and the antibacterial soap provided. Be sure to pay special attention to beneath the toenails and in between the toes.  Wash for at least three (3) minutes. Rinse thoroughly with water and dry well with a towel.  Perform this wash unless told not to do so by your physician.  Enclosed: 1 Ice pack (please put in freezer the night before surgery)   1 Hibiclens skin cleaner     Pre-op instructions  If you have any questions regarding the instructions, please do not hesitate to call our office.  [place]: [address], [place], [place] [address] -- [phone]  [place]: [address] [place], [place] [address] -- [phone]  [place]: [address], [place], [place] [address] -- [phone]   Website: [email or web address] 

## 2019-04-17 NOTE — Progress Notes (Signed)
BP 111/69   Pulse 77   Resp 16    Subjective:    Patient ID: Jody Chen, female    DOB: 02/28/1961, 59 y.o.   MRN: [id number]  HPI: Jody Chen is a 59 y.o. female presenting on 04/15/2019 for concerns of bunions to both feet with the right worse than left. She states this has been an ongoing issue for some time. She gets pain to the "bump" on the side of the foot but also when she does piliates and she bends the big toe she gets discomfort. She states when she bends the big toe up and puts stress on it then the toe becomes uncomfortable.  She has tried shoe modifications, padding, offloading without any improvement. She has previously seen Dr. Posey Pronto for this. At this time she wishes to proceed with surgery on the right foot.   Relevant past medical, surgical, family and social history reviewed and updated as indicated. Interim medical history since our last visit reviewed. Allergies and medications reviewed and updated.  Current Outpatient Medications on File Prior to Visit  Medication Sig  . acyclovir (ZOVIRAX) 200 MG capsule acyclovir 200 mg capsule  . albuterol (VENTOLIN HFA) 108 (90 Base) MCG/ACT inhaler Ventolin HFA 90 mcg/actuation aerosol inhaler  . ALPRAZolam (XANAX) 0.5 MG tablet alprazolam 0.5 mg tablet  . alprazolam (XANAX) 2 MG tablet alprazolam 2 mg tablet  TAKE 1/2 TABLET ORALLY EVERY 8 HOURS AS NEEDED FOR ANXIETY  . Azelastine-Fluticasone 137-50 MCG/ACT SUSP azelastine-fluticasone 137 mcg-50 mcg/spray nasal spray  . azithromycin (ZITHROMAX) 250 MG tablet Take 2 tabs PO x 1 dose, then 1 tab PO QD x 4 days  . cyclobenzaprine (FLEXERIL) 10 MG tablet cyclobenzaprine 10 mg tablet  Take 1 tablet 3 times a day by oral route as needed.  . diazepam (VALIUM) 10 MG tablet diazepam 10 mg tablet  TAKE 1 TABLET BY MOUTH AT BEDTIME AS NEEDED FOR INSOMNIA  . doxycycline (VIBRA-TABS) 100 MG tablet doxycycline hyclate 100 mg tablet  . Estradiol (ESTROGEL) 0.75 MG/1.25 GM (0.06%)  topical gel EstroGel 1.25 gram/actuation (0.06%) transdermal gel pump  . fluconazole (DIFLUCAN) 150 MG tablet Take 1 tablet (150 mg total) by mouth once.  . hydroxychloroquine (PLAQUENIL) 200 MG tablet hydroxychloroquine 200 mg tablet  . imiquimod (ALDARA) 5 % cream imiquimod 5 % topical cream packet  . levocetirizine (XYZAL) 5 MG tablet   . levothyroxine (SYNTHROID, LEVOTHROID) 175 MCG tablet Take 175 mcg by mouth daily.  . medroxyPROGESTERone (PROVERA) 5 MG tablet medroxyprogesterone 5 mg tablet  TAKE 1 TABLET BY MOUTH ONCE DAILY  . meloxicam (MOBIC) 15 MG tablet Mobic 15 mg tablet  Take 1 tablet every day by oral route.  . mercaptopurine (PURINETHOL) 50 MG tablet mercaptopurine 50 mg tablet  . mesalamine (LIALDA) 1.2 g EC tablet Take 1.2 g by mouth daily with breakfast.  . metroNIDAZOLE (FLAGYL) 500 MG tablet metronidazole 500 mg tablet  TK 1 T PO  BID  . montelukast (SINGULAIR) 10 MG tablet montelukast 10 mg tablet  . rifaximin (XIFAXAN) 550 MG TABS tablet Xifaxan 550 mg tablet  . sulfacetamide-prednisolone (BLEPHAMIDE) 10-0.2 % ophthalmic suspension Blephamide 10 %-0.2 % eye drops,suspension  USE AS DIRECTED  . SUMAtriptan (IMITREX) 50 MG tablet Imitrex 50 mg tablet  Take 1 tablet every 6 hours by oral route as needed for headache.  . testosterone cypionate (DEPO-TESTOSTERONE) 200 MG/ML injection Depo-Testosterone 200 mg/mL intramuscular oil  Inject 0.5 mL every 2 months by intramuscular route.  Marland Kitchen  valACYclovir (VALTREX) 1000 MG tablet valacyclovir 1 gram tablet  . zolpidem (AMBIEN) 10 MG tablet Ambien 10 mg tablet  Take 1 tablet by oral route at bedtime.   No current facility-administered medications on file prior to visit.    Review of systems:  Per HPI unless specifically indicated above     Objective:    BP 111/69   Pulse 77   Resp 16   Wt Readings from Last 3 Encounters:  04/20/15 134 lb 6.4 oz (61 kg)  05/06/11 137 lb 3.2 oz (62.2 kg)    Physical Exam       General: AAO x3, NAD  Dermatological: Skin is warm, dry and supple bilateral. Nails x 10 are well manicured; remaining integument appears unremarkable at this time. There are no open sores, no preulcerative lesions, no rash or signs of infection present.  Vascular: Dorsalis Pedis artery and Posterior Tibial artery pedal pulses are 2/4 bilateral with immedate capillary fill time. Pedal hair growth present. No varicosities and no lower extremity edema present bilateral. There is no pain with calf compression, swelling, warmth, erythema.   Neruologic: Grossly intact via light touch bilateral. Vibratory intact via tuning fork bilateral. Protective threshold with Semmes Wienstein monofilament intact to all pedal sites bilateral.  Musculoskeletal: Moderate bunion deformities present bilaterally.  There is is mild discomfort at end range of motion dorsiflexion of the first MPJ.  No crepitation with range of motion.  No first ray hypermobility present.  Muscular strength 5/5 in all groups tested bilateral.  Gait: Unassisted, Nonantalgic.   Assessment & Plan:  59 y.o. female presents for bunion deformity right side worse than left  -Treatment options discussed including all alternatives, risks, and complications. I discussed both conservative and surgical treatment options.  -X-rays obtained reviewed.  Moderate bunion deformities present with mild arthritic changes present the first MPJ.  Elongated first metatarsal. -We discussed both conservative as well as surgical treatment options.  At this time she is attempted conservative care with insignificant improvement she also proceed with surgical intervention. -Discussed also bunionectomy to help reduce the bunion deformity as well as shortening first metatarsal.  After discussion she wishes to proceed with this.  ABI was performed in the office to insure adequate pain.  ABI was 1.36 bilaterally and was in the normal range.  -We will plan for  bidirectional also bunionectomy on Jan 20th -The incision placement as well as the postoperative course was discussed with the patient. I discussed risks of the surgery which include, but not limited to, infection, bleeding, pain, swelling, need for further surgery, delayed or nonhealing, painful or ugly scar, numbness or sensation changes, over/under correction, recurrence, transfer lesions, further deformity, hardware failure, DVT/PE, loss of toe/foot. Patient understands these risks and wishes to proceed with surgery. The surgical consent was reviewed with the patient all 3 pages were signed. No promises or guarantees were given to the outcome of the procedure. All questions were answered to the best of my ability. Before the surgery the patient was encouraged to call the office if there is any further questions. The surgery will [facility] performed at [facility] on an outpatient basis. -CAM Boot dispensed for postop use.  -Will order her a shower cast cover -She has held her Crohn's medication since the pandemic started. She has not been taking plaquenil and she denies any other immunosuppressants at this time.  -Follow-up after surgery or sooner if any problems arise. In the meantime, encouraged to call the office with any questions,  concerns, change in symptoms.   Celesta Gentile, DPM

## 2019-04-19 ENCOUNTER — Telehealth: Payer: Self-pay | Admitting: *Deleted

## 2019-04-19 MED FILL — AZELASTINE-FLUTICASONE 137-: 137-50 | 30 days supply | Qty: 23 | Fill #3

## 2019-04-19 NOTE — Telephone Encounter (Signed)
DOS 04/28/2019 [place] BUNIONECTOM[place] RIGHT FOOT - 28296  [facility]: Eligibility Date - 04/09/2019  Benefit percentage  60%Plan pays  40%You pay  Individual deductible $300.00 to go  $0.00 out of $300  Individual integrated out-of-pocket $7,895.00 to go  $5.00 out of $0100.71  Pre-certification is NOT REQUIRED.

## 2019-04-22 DIAGNOSIS — E890 Postprocedural hypothyroidism: Secondary | ICD-10-CM | POA: Diagnosis not present

## 2019-04-22 DIAGNOSIS — L659 Nonscarring hair loss, unspecified: Secondary | ICD-10-CM | POA: Diagnosis not present

## 2019-04-27 MED FILL — ESTROGEL 0.06% GEL: 0.75 MG/1.2 | 30 days supply | Qty: 50 | Fill #2

## 2019-04-28 ENCOUNTER — Encounter: Payer: Self-pay | Admitting: Podiatry

## 2019-04-28 ENCOUNTER — Other Ambulatory Visit: Payer: Self-pay | Admitting: Podiatry

## 2019-04-28 DIAGNOSIS — E039 Hypothyroidism, unspecified: Secondary | ICD-10-CM | POA: Diagnosis not present

## 2019-04-28 DIAGNOSIS — M2011 Hallux valgus (acquired), right foot: Secondary | ICD-10-CM | POA: Diagnosis not present

## 2019-04-28 DIAGNOSIS — M25571 Pain in right ankle and joints of right foot: Secondary | ICD-10-CM | POA: Diagnosis not present

## 2019-04-28 MED ORDER — ONDANSETRON HCL 4 MG PO TABS: 4.0000 mg | ORAL_TABLET | Freq: Three times a day (TID) | ORAL | 0 refills | Status: DC | PRN

## 2019-04-28 MED ORDER — OXYCODONE-ACETAMINOPHEN 5-325 MG PO TABS
1.0000 | ORAL_TABLET | Freq: Four times a day (QID) | ORAL | 0 refills | Status: DC | PRN
Start: 2019-04-28 — End: 2019-06-02

## 2019-04-28 MED FILL — ONDANSETRON HCL 4 MG TABLET: 4 | 5 days supply | Qty: 15 | Fill #0

## 2019-04-28 MED FILL — OXYCODONE-APAP 5-325MG: 5-325 | 2 days supply | Qty: 20 | Fill #0

## 2019-04-28 NOTE — Progress Notes (Signed)
Post-op medications sent to the pharmacy.

## 2019-04-29 ENCOUNTER — Telehealth: Payer: Self-pay | Admitting: *Deleted

## 2019-04-29 NOTE — Telephone Encounter (Signed)
Pt called states she is returning Lisa's call. I told pt I could help her and she would not have to wait. Pt states she is doing well, but has been slow to have a bowel movement, even with taking her usual ClenzMore doubling up. I told pt to increase her fluids, add prune juice or prunes. Pt states understanding.

## 2019-04-29 NOTE — Telephone Encounter (Signed)
Called and left a message for the patient-stating I was calling to check on the patient after having surgery on Wednesday with Dr Jacqualyn Posey and stated to call the [place] office at [phone].Lattie Haw

## 2019-05-04 ENCOUNTER — Encounter: Payer: Self-pay | Admitting: Podiatry

## 2019-05-04 ENCOUNTER — Ambulatory Visit (INDEPENDENT_AMBULATORY_CARE_PROVIDER_SITE_OTHER): Payer: 59

## 2019-05-04 ENCOUNTER — Ambulatory Visit (INDEPENDENT_AMBULATORY_CARE_PROVIDER_SITE_OTHER): Payer: 59 | Admitting: Podiatry

## 2019-05-04 ENCOUNTER — Other Ambulatory Visit: Payer: Self-pay

## 2019-05-04 DIAGNOSIS — M2011 Hallux valgus (acquired), right foot: Secondary | ICD-10-CM

## 2019-05-04 DIAGNOSIS — Z9889 Other specified postprocedural states: Secondary | ICD-10-CM

## 2019-05-10 DIAGNOSIS — M2011 Hallux valgus (acquired), right foot: Secondary | ICD-10-CM | POA: Insufficient documentation

## 2019-05-10 NOTE — Progress Notes (Signed)
Subjective: Jody Chen is a 59 y.o. is seen today in office s/p right foot [place] bunioectomy preformed on . They state their pain is 04/28/2019.  She states that she has been doing well her pain is controlled.  Is been tracking her foot ice elevate as much as possible but she does have that she may been on her feet more than she should have recently.  Denies any systemic complaints such as fevers, chills, nausea, vomiting. No calf pain, chest pain, shortness of breath.   Objective: General: No acute distress, AAOx3  DP/PT pulses palpable 2/4, CRT < 3 sec to all digits.  Protective sensation intact. Motor function intact.  RIGHT foot: Incision is well coapted without any evidence of dehiscence with sutures intact. There is no surrounding erythema, ascending cellulitis, fluctuance, crepitus, malodor, drainage/purulence. There is mild edema around the surgical site. There is mild pain along the surgical site.  Mild ecchymosis No other areas of tenderness to bilateral lower extremities.  No other open lesions or pre-ulcerative lesions.  No pain with calf compression, swelling, warmth, erythema.   Assessment and Plan:  Status post right foot Austin bunionectomy, doing well with no complications   -Treatment options discussed including all alternatives, risks, and complications -X-rays obtained and reviewed.  Hardware intact.  No evidence of acute fracture.  Joint adequate alignment. -Antibiotic ointment and dressing applied.  Keep the dressing clean, dry, intact.  She can change this next week if needed.  Continue a cam boot for now but she also can use a surgical shoe at home if needed this was dispensed today. -Ice/elevation -Pain medication as needed. -Monitor for any clinical signs or symptoms of infection and DVT/PE and directed to call the office immediately should any occur or go to the ER. -Follow-up as scheduled or sooner if any problems arise. In the meantime, encouraged to call the  office with any questions, concerns, change in symptoms.   Celesta Gentile, DPM

## 2019-05-14 ENCOUNTER — Encounter: Payer: 59 | Admitting: Podiatry

## 2019-05-17 ENCOUNTER — Other Ambulatory Visit: Payer: Self-pay

## 2019-05-17 ENCOUNTER — Encounter: Payer: Self-pay | Admitting: Podiatry

## 2019-05-17 ENCOUNTER — Ambulatory Visit (INDEPENDENT_AMBULATORY_CARE_PROVIDER_SITE_OTHER): Payer: 59 | Admitting: Podiatry

## 2019-05-17 VITALS — BP 97/63 | HR 78 | Temp 97.1°F

## 2019-05-17 DIAGNOSIS — M2011 Hallux valgus (acquired), right foot: Secondary | ICD-10-CM

## 2019-05-17 DIAGNOSIS — Z9889 Other specified postprocedural states: Secondary | ICD-10-CM

## 2019-05-18 NOTE — Progress Notes (Signed)
Subjective: Jody Chen is a 59 y.o. is seen today in office s/p right foot Austin bunioectomy preformed on 04/28/2019.  She states that overall she is doing well.  Some occasional discomfort but not take any significant pain medication.  She does get some swelling has a small bruise on the toe.  Otherwise she can try to move her toes some.  She wears the cam boot if she leaves the house but she wears a surgical shoe at home. Denies any systemic complaints such as fevers, chills, nausea, vomiting. No calf pain, chest pain, shortness of breath.   Objective: General: No acute distress, AAOx3  DP/PT pulses palpable 2/4, CRT < 3 sec to all digits.  Protective sensation intact. Motor function intact.  RIGHT foot: Incision is well coapted without any evidence of dehiscence with sutures intact. There is no surrounding erythema, ascending cellulitis, fluctuance, crepitus, malodor, drainage/purulence. There is decreased edema around the surgical site. There is decreased pain along the surgical site.  Mild ecchymosis on the digit but overall improving.  No significant pain with range of motion of the MPJ. No other areas of tenderness to bilateral lower extremities.  No other open lesions or pre-ulcerative lesions.  No pain with calf compression, swelling, warmth, erythema.   Assessment and Plan:  Status post right foot Austin bunionectomy, doing well with no complications   -Treatment options discussed including all alternatives, risks, and complications -Suture knot was cut.  She can start to shower.  Apply a small amount of antibiotic ointment and a dressing daily.  Discussed with her to start range of motion exercises.  Continue mobilization for now.  I will see her back in 2 weeks at that time start physical therapy.  Return in about 2 weeks (around 05/31/2019).  Trula Slade DPM

## 2019-05-27 ENCOUNTER — Encounter: Payer: 59 | Admitting: Podiatry

## 2019-06-01 ENCOUNTER — Encounter: Payer: 59 | Admitting: Podiatry

## 2019-06-02 ENCOUNTER — Ambulatory Visit (INDEPENDENT_AMBULATORY_CARE_PROVIDER_SITE_OTHER): Payer: 59

## 2019-06-02 ENCOUNTER — Other Ambulatory Visit: Payer: Self-pay

## 2019-06-02 ENCOUNTER — Encounter: Payer: Self-pay | Admitting: Podiatry

## 2019-06-02 ENCOUNTER — Ambulatory Visit (INDEPENDENT_AMBULATORY_CARE_PROVIDER_SITE_OTHER): Payer: 59 | Admitting: Podiatry

## 2019-06-02 VITALS — Temp 97.5°F

## 2019-06-02 DIAGNOSIS — M2011 Hallux valgus (acquired), right foot: Secondary | ICD-10-CM | POA: Diagnosis not present

## 2019-06-02 DIAGNOSIS — Z9889 Other specified postprocedural states: Secondary | ICD-10-CM

## 2019-06-03 DIAGNOSIS — Z23 Encounter for immunization: Secondary | ICD-10-CM | POA: Diagnosis not present

## 2019-06-03 DIAGNOSIS — M25674 Stiffness of right foot, not elsewhere classified: Secondary | ICD-10-CM | POA: Diagnosis not present

## 2019-06-03 DIAGNOSIS — M79671 Pain in right foot: Secondary | ICD-10-CM | POA: Diagnosis not present

## 2019-06-03 DIAGNOSIS — L570 Actinic keratosis: Secondary | ICD-10-CM | POA: Diagnosis not present

## 2019-06-03 DIAGNOSIS — R262 Difficulty in walking, not elsewhere classified: Secondary | ICD-10-CM | POA: Diagnosis not present

## 2019-06-03 DIAGNOSIS — M6281 Muscle weakness (generalized): Secondary | ICD-10-CM | POA: Diagnosis not present

## 2019-06-03 DIAGNOSIS — B078 Other viral warts: Secondary | ICD-10-CM | POA: Diagnosis not present

## 2019-06-03 MED FILL — FLUOROURACIL 5 % CREA: 5 | 21 days supply | Qty: 40 | Fill #0

## 2019-06-06 NOTE — Progress Notes (Signed)
Subjective: Jody Chen is a 59 y.o. is seen today in office s/p right foot Austin bunioectomy preformed on 04/28/2019.  Overall she states that she is doing well.  She has been walking a lot more recently but she has been wearing the cam boot.  She has noticed this recently her big toe sits up some when she stands.  She is not sure if this is because she is been doing a lot of walking and she is been on her feet more recently.  She denies any recent injury. Denies any systemic complaints such as fevers, chills, nausea, vomiting. No calf pain, chest pain, shortness of breath.   Objective: General: No acute distress, AAOx3  DP/PT pulses palpable 2/4, CRT < 3 sec to all digits.  Protective sensation intact. Motor function intact.  RIGHT foot: Incision is well coapted without any evidence of dehiscence and the scar is formed.  There is mild discomfort on the distal aspect incision.  There is improved range of motion of the first MPJ.  There is mild swelling.  There is no erythema or warmth.  There is no other areas of discomfort elicited today.   No other open lesions or pre-ulcerative lesions.  No pain with calf compression, swelling, warmth, erythema.   Assessment and Plan:  Status post right foot Austin bunionectomy, doing well with no complications   -Treatment options discussed including all alternatives, risks, and complications -X-rays obtained reviewed.  Hardware intact status post first metatarsal osteotomy with screw fixation without any complicating factors. -Prescription for physical therapy was written [facility].  Discussed range of motion exercises.  As she progresses with physical therapy she can start to transition back to regular shoe next week.  Continue to ice elevate.  Continue vitamin E on the incision.  Return in about 3 weeks (around 06/23/2019).  Trula Slade DPM

## 2019-06-07 DIAGNOSIS — M25674 Stiffness of right foot, not elsewhere classified: Secondary | ICD-10-CM | POA: Diagnosis not present

## 2019-06-07 DIAGNOSIS — R262 Difficulty in walking, not elsewhere classified: Secondary | ICD-10-CM | POA: Diagnosis not present

## 2019-06-07 DIAGNOSIS — M79671 Pain in right foot: Secondary | ICD-10-CM | POA: Diagnosis not present

## 2019-06-07 DIAGNOSIS — M6281 Muscle weakness (generalized): Secondary | ICD-10-CM | POA: Diagnosis not present

## 2019-06-11 DIAGNOSIS — M79671 Pain in right foot: Secondary | ICD-10-CM | POA: Diagnosis not present

## 2019-06-11 DIAGNOSIS — M25674 Stiffness of right foot, not elsewhere classified: Secondary | ICD-10-CM | POA: Diagnosis not present

## 2019-06-11 DIAGNOSIS — M6281 Muscle weakness (generalized): Secondary | ICD-10-CM | POA: Diagnosis not present

## 2019-06-11 DIAGNOSIS — R262 Difficulty in walking, not elsewhere classified: Secondary | ICD-10-CM | POA: Diagnosis not present

## 2019-06-15 DIAGNOSIS — R262 Difficulty in walking, not elsewhere classified: Secondary | ICD-10-CM | POA: Diagnosis not present

## 2019-06-15 DIAGNOSIS — M6281 Muscle weakness (generalized): Secondary | ICD-10-CM | POA: Diagnosis not present

## 2019-06-15 DIAGNOSIS — M79671 Pain in right foot: Secondary | ICD-10-CM | POA: Diagnosis not present

## 2019-06-15 DIAGNOSIS — M25674 Stiffness of right foot, not elsewhere classified: Secondary | ICD-10-CM | POA: Diagnosis not present

## 2019-06-16 MED FILL — SYNTHROID 200 MCG TABLET: 200 | 90 days supply | Qty: 90 | Fill #2

## 2019-06-16 MED FILL — PROGESTERONE 100 MG CAPSULE: 100 | 90 days supply | Qty: 90 | Fill #2

## 2019-06-17 DIAGNOSIS — M6281 Muscle weakness (generalized): Secondary | ICD-10-CM | POA: Diagnosis not present

## 2019-06-17 DIAGNOSIS — M25674 Stiffness of right foot, not elsewhere classified: Secondary | ICD-10-CM | POA: Diagnosis not present

## 2019-06-17 DIAGNOSIS — M79671 Pain in right foot: Secondary | ICD-10-CM | POA: Diagnosis not present

## 2019-06-17 DIAGNOSIS — R262 Difficulty in walking, not elsewhere classified: Secondary | ICD-10-CM | POA: Diagnosis not present

## 2019-06-22 ENCOUNTER — Ambulatory Visit (INDEPENDENT_AMBULATORY_CARE_PROVIDER_SITE_OTHER): Payer: 59

## 2019-06-22 ENCOUNTER — Ambulatory Visit (INDEPENDENT_AMBULATORY_CARE_PROVIDER_SITE_OTHER): Payer: 59 | Admitting: Podiatry

## 2019-06-22 ENCOUNTER — Encounter: Payer: Self-pay | Admitting: Podiatry

## 2019-06-22 ENCOUNTER — Other Ambulatory Visit: Payer: Self-pay

## 2019-06-22 VITALS — BP 97/62 | HR 77 | Temp 96.4°F | Resp 16

## 2019-06-22 DIAGNOSIS — Z9889 Other specified postprocedural states: Secondary | ICD-10-CM

## 2019-06-22 DIAGNOSIS — M2011 Hallux valgus (acquired), right foot: Secondary | ICD-10-CM | POA: Diagnosis not present

## 2019-06-24 DIAGNOSIS — M6281 Muscle weakness (generalized): Secondary | ICD-10-CM | POA: Diagnosis not present

## 2019-06-24 DIAGNOSIS — M25674 Stiffness of right foot, not elsewhere classified: Secondary | ICD-10-CM | POA: Diagnosis not present

## 2019-06-24 DIAGNOSIS — R262 Difficulty in walking, not elsewhere classified: Secondary | ICD-10-CM | POA: Diagnosis not present

## 2019-06-24 DIAGNOSIS — M79671 Pain in right foot: Secondary | ICD-10-CM | POA: Diagnosis not present

## 2019-06-27 NOTE — Progress Notes (Signed)
Subjective: Jody Chen is a 59 y.o. is seen today in office s/p right foot Austin bunioectomy preformed on 04/28/2019.  She states that she has been on her feet quite a bit recently.  She just got back from [place] last night and was doing a lot of walking.  She has had swelling and because that she return back to the surgical shoe.  She has been wearing a regular shoe.  She still doing physical therapy.  No recent injury.  Incisions healing well.  Denies any fevers, chills, nausea, vomiting.  No calf pain, chest pain, shortness of breath.   Objective: General: No acute distress, AAOx3  DP/PT pulses palpable 2/4, CRT < 3 sec to all digits.  Protective sensation intact. Motor function intact.  RIGHT foot: Incision is well coapted without any evidence of dehiscence and the scar is formed.  There is mild to moderate swelling present.  There is no erythema or warmth.  No pain with MPJ range of motion.  Minimal tenderness palpation of the surgical site.  Toes in rectus position.  No other open lesions or pre-ulcerative lesions.  No pain with calf compression, swelling, warmth, erythema.   Assessment and Plan:  Status post right foot Austin bunionectomy, doing well with no complications   -Treatment options discussed including all alternatives, risks, and complications -X-rays obtained reviewed.  Hardware intact without any complicating factors.  On the lateral view there is still some healing along the osteotomy but there is increased consolidation.  No evidence of acute fracture. -Continue compression anklet, ice elevation.  Discussed with her trying not to overdo.  For now remain in surgical shoe and has swelling improved she can return to the regular shoe.  Gradually increase activity.  Return in about 4 weeks (around 07/20/2019).  Trula Slade DPM

## 2019-06-29 DIAGNOSIS — R262 Difficulty in walking, not elsewhere classified: Secondary | ICD-10-CM | POA: Diagnosis not present

## 2019-06-29 DIAGNOSIS — M79671 Pain in right foot: Secondary | ICD-10-CM | POA: Diagnosis not present

## 2019-06-29 DIAGNOSIS — M6281 Muscle weakness (generalized): Secondary | ICD-10-CM | POA: Diagnosis not present

## 2019-06-29 DIAGNOSIS — M25674 Stiffness of right foot, not elsewhere classified: Secondary | ICD-10-CM | POA: Diagnosis not present

## 2019-07-01 MED FILL — AZELASTINE-FLUTICASONE 137-: 137-50 | 30 days supply | Qty: 23 | Fill #4

## 2019-07-19 MED FILL — ESTROGEL 0.06% GEL: 0.75 MG/1.2 | 30 days supply | Qty: 50 | Fill #3

## 2019-07-20 ENCOUNTER — Ambulatory Visit (INDEPENDENT_AMBULATORY_CARE_PROVIDER_SITE_OTHER): Payer: 59 | Admitting: Podiatry

## 2019-07-20 ENCOUNTER — Other Ambulatory Visit: Payer: Self-pay

## 2019-07-20 ENCOUNTER — Encounter: Payer: Self-pay | Admitting: Podiatry

## 2019-07-20 ENCOUNTER — Ambulatory Visit (INDEPENDENT_AMBULATORY_CARE_PROVIDER_SITE_OTHER): Payer: 59

## 2019-07-20 DIAGNOSIS — M2011 Hallux valgus (acquired), right foot: Secondary | ICD-10-CM

## 2019-07-20 DIAGNOSIS — Z9889 Other specified postprocedural states: Secondary | ICD-10-CM

## 2019-07-21 MED ORDER — TERBINAFINE HCL 250 MG PO TABS: 250.0000 mg | ORAL_TABLET | Freq: Every day | ORAL | 0 refills | Status: DC

## 2019-07-21 MED FILL — TERBINAFINE HCL 250 MG TAB: 250 | 60 days supply | Qty: 60 | Fill #0

## 2019-07-21 NOTE — Progress Notes (Signed)
Subjective: Jody Chen is a 59 y.o. is seen today in office s/p right foot Austin bunioectomy preformed on 04/28/2019.  Overall she states that she is doing well.  She still gets some swelling but she has been very active on her feet.  She recently just got back from vacation in [place] when she was hiking and doing a lot of activity.  She has not gone to physical therapy for last couple of weeks.  Otherwise no recent injury or changes. Denies any fevers, chills, nausea, vomiting.  No calf pain, chest pain, shortness of breath.   Objective: General: No acute distress, AAOx3  DP/PT pulses palpable 2/4, CRT < 3 sec to all digits.  Protective sensation intact. Motor function intact.  RIGHT foot: Incision is well coapted without any evidence of dehiscence and the scar is formed.  There is mild restriction for some patient range of motion.  Minimal swelling.  There is no erythema or warmth.  No other areas of discomfort identified. No pain with calf compression, swelling, warmth, erythema.   Assessment and Plan:  Status post right foot Austin bunionectomy, doing well with no complications   -Treatment options discussed including all alternatives, risks, and complications -X-rays obtained reviewed.  Increased consolidation across the osteotomy site.  Hardware intact without any complicating factors. -There is still some swelling evident.  Discussed with her continue compression, elevation especially after she has been on her feet all day.  Wear supportive shoes.  Continue range of motion exercises.  She is new to this at home as opposed to going to physical therapy.  At this point we will see her back as needed.  She is any questions or concerns to let me know.   Trula Slade DPM

## 2019-07-29 ENCOUNTER — Other Ambulatory Visit: Payer: Self-pay | Admitting: Gastroenterology

## 2019-07-29 DIAGNOSIS — K50919 Crohn's disease, unspecified, with unspecified complications: Secondary | ICD-10-CM

## 2019-07-29 DIAGNOSIS — K5090 Crohn's disease, unspecified, without complications: Secondary | ICD-10-CM | POA: Diagnosis not present

## 2019-07-29 DIAGNOSIS — K5000 Crohn's disease of small intestine without complications: Secondary | ICD-10-CM | POA: Diagnosis not present

## 2019-07-29 MED FILL — HYDROCORTISONE AC 25 MG SUP: 25 | 30 days supply | Qty: 30 | Fill #0

## 2019-07-29 MED FILL — LIALDA 1.2 GM TABLET SA: 1.2 | 90 days supply | Qty: 360 | Fill #0

## 2019-08-09 ENCOUNTER — Other Ambulatory Visit: Payer: Self-pay

## 2019-08-09 ENCOUNTER — Ambulatory Visit
Admission: RE | Admit: 2019-08-09 | Discharge: 2019-08-09 | Disposition: A | Discharge status: Home / Self Care | Payer: 59 | Source: Ambulatory Visit | Attending: Gastroenterology | Admitting: Gastroenterology

## 2019-08-09 DIAGNOSIS — K50919 Crohn's disease, unspecified, with unspecified complications: Secondary | ICD-10-CM

## 2019-08-09 DIAGNOSIS — K5090 Crohn's disease, unspecified, without complications: Secondary | ICD-10-CM | POA: Diagnosis not present

## 2019-08-09 MED ORDER — IOPAMIDOL (ISOVUE-300) INJECTION 61%
100.0000 mL | Freq: Once | INTRAVENOUS | Status: AC | PRN
  Administered 2019-08-09: 100 mL via INTRAVENOUS

## 2019-08-11 ENCOUNTER — Other Ambulatory Visit (HOSPITAL_COMMUNITY): Payer: Self-pay | Admitting: Allergy and Immunology

## 2019-08-11 DIAGNOSIS — J3089 Other allergic rhinitis: Secondary | ICD-10-CM | POA: Diagnosis not present

## 2019-08-11 DIAGNOSIS — H1045 Other chronic allergic conjunctivitis: Secondary | ICD-10-CM | POA: Diagnosis not present

## 2019-08-11 DIAGNOSIS — J3081 Allergic rhinitis due to animal (cat) (dog) hair and dander: Secondary | ICD-10-CM | POA: Diagnosis not present

## 2019-08-11 DIAGNOSIS — J301 Allergic rhinitis due to pollen: Secondary | ICD-10-CM | POA: Diagnosis not present

## 2019-08-11 MED FILL — AZELASTINE-FLUTICASONE 137-: 137-50 | 30 days supply | Qty: 23 | Fill #0

## 2019-08-11 MED FILL — LEVOCETIRIZINE 5 MG TABLET: 5 | 30 days supply | Qty: 30 | Fill #0

## 2019-08-17 DIAGNOSIS — K5000 Crohn's disease of small intestine without complications: Secondary | ICD-10-CM | POA: Diagnosis not present

## 2019-08-19 DIAGNOSIS — K5000 Crohn's disease of small intestine without complications: Secondary | ICD-10-CM | POA: Diagnosis not present

## 2019-08-30 MED FILL — predniSONE 10 MG TABS: 10 | 20 days supply | Qty: 25 | Fill #0

## 2019-09-10 ENCOUNTER — Encounter: Payer: Self-pay | Admitting: Pharmacist

## 2019-09-10 ENCOUNTER — Telehealth (INDEPENDENT_AMBULATORY_CARE_PROVIDER_SITE_OTHER): Payer: 59 | Admitting: Pharmacist

## 2019-09-10 DIAGNOSIS — K5090 Crohn's disease, unspecified, without complications: Secondary | ICD-10-CM

## 2019-09-10 MED ORDER — HUMIRA-CD/UC/HS STARTER 80 MG/0.8ML ~~LOC~~ AJKT: AUTO-INJECTOR | SUBCUTANEOUS | 0 refills | Status: DC

## 2019-09-10 MED ORDER — HUMIRA PEN 40 MG/0.8ML ~~LOC~~ PNKT: PEN_INJECTOR | SUBCUTANEOUS | 12 refills | Status: DC

## 2019-09-10 NOTE — Progress Notes (Signed)
  S: Patient presents telephonically for review of their specialty medication therapy.  Patient is currently prescribed Humira for Crohns disease. Patient is managed by Dr. Watt Climes for this. She has not started the Humira yet.  Dosing:  Crohn disease: SubQ (may continue aminosalicylates and/or corticosteroids; if necessary, azathioprine, mercaptopurine, or methotrexate may also be continued): Initial: 160 mg (given as four 40 mg injections on day 1 or given as two 40 mg injections per day over 2 consecutive days), then 80 mg 2 weeks later (day 15). Maintenance: 40 mg every other week beginning day 29. Note: Some patients may require 40 mg every week as maintenance therapy Karl Pock [id number]).  Dose adjustments: Renal: no dose adjustments (has not been studied) Hepatic: no dose adjustments (has not been studied)  Drug-drug interactions: none  Screening: TB test: not on file Hepatitis: not on file  Monitoring: S/sx of infection: denies CBC: not on file  Of note, patient has history of malignant neoplasm of skin.  O:     No results found for: WBC, HGB, HCT, MCV, PLT    Chemistry   No results found for: NA, K, CL, CO2, BUN, CREATININE, GLU No results found for: CALCIUM, ALKPHOS, AST, ALT, BILITOT     A/P: 1. Medication review: Patient currently prescribed Humira for Crohns disease. Reviewed the medication with the patient, including the following: Humira is a TNF blocking agent indicated for ankylosing spondylitis, Crohn's disease, Hidradenitis suppurativa, psoriatic arthritis, plaque psoriasis, ulcerative colitis, and uveitis. Patient educated on purpose, proper use and potential adverse effects of Humira. Possible adverse effects are increased risk of infections, headache, and injection site reactions. There is the possibility of an increased risk of malignancy but it is not well understood if this increased risk is due to there medication or the disease state. Of note, patient does  have history of malignant neoplasm of the skin on her problem list. There are rare cases of pancytopenia and aplastic anemia so her GI physician will monitor blood work.    Patient would like the citrate-free. I believe the [facility] insurance requires that she fail regular Humira first but will verify with pharmacy and will let her know. Will also provide her with Luke's contact information in case she would have further questions about the [facility] program.  No recommendations for any changes at this time.  Christella Hartigan, PharmD, BCPS, BCACP, CPP Clinical Pharmacist Practitioner

## 2019-09-15 ENCOUNTER — Other Ambulatory Visit: Payer: Self-pay | Admitting: Pharmacist

## 2019-09-15 MED ORDER — HUMIRA-CD/UC/HS STARTER 40 MG/0.8ML ~~LOC~~ PNKT: PEN_INJECTOR | SUBCUTANEOUS | 0 refills | Status: DC

## 2019-09-15 MED ORDER — HUMIRA-CD/UC/HS STARTER 80 MG/0.8ML ~~LOC~~ AJKT: AUTO-INJECTOR | SUBCUTANEOUS | 0 refills | Status: DC

## 2019-09-20 MED FILL — predniSONE 10 MG TABS: 10 | 20 days supply | Qty: 25 | Fill #1

## 2019-09-20 MED FILL — PROMETHAZINE HCL 25 MG SUPP: 25 | 7 days supply | Qty: 30 | Fill #0

## 2019-09-21 ENCOUNTER — Other Ambulatory Visit (HOSPITAL_COMMUNITY): Payer: Self-pay | Admitting: Endocrinology

## 2019-09-21 MED FILL — SYNTHROID 200 MCG TABLET: 200 | 30 days supply | Qty: 30 | Fill #0

## 2019-10-06 MED FILL — predniSONE 10 MG TABS: 10 | 30 days supply | Qty: 30 | Fill #0

## 2019-10-13 MED FILL — PROGESTERONE 100 MG CAPS: 100 | 90 days supply | Qty: 90 | Fill #3

## 2019-10-13 MED FILL — HUMIRA PEN 40 MG/0.8ML PNKT: 40 | 28 days supply | Qty: 2 | Fill #0

## 2019-10-20 MED FILL — ESTROGEL 0.06% GEL: 0.75 MG/1.2 | 30 days supply | Qty: 50 | Fill #4

## 2019-10-27 MED FILL — SYNTHROID 200 MCG TABLET: 200 | 90 days supply | Qty: 90 | Fill #1

## 2019-11-08 DIAGNOSIS — K5000 Crohn's disease of small intestine without complications: Secondary | ICD-10-CM | POA: Diagnosis not present

## 2019-11-08 MED FILL — HUMIRA PEN 40 MG/0.8ML PNKT: 40 | 28 days supply | Qty: 2 | Fill #1

## 2019-11-09 MED FILL — predniSONE 5 MG TABS: 5 | 21 days supply | Qty: 14 | Fill #0

## 2019-11-25 DIAGNOSIS — Z85828 Personal history of other malignant neoplasm of skin: Secondary | ICD-10-CM | POA: Diagnosis not present

## 2019-11-25 DIAGNOSIS — D485 Neoplasm of uncertain behavior of skin: Secondary | ICD-10-CM | POA: Diagnosis not present

## 2019-11-25 DIAGNOSIS — D225 Melanocytic nevi of trunk: Secondary | ICD-10-CM | POA: Diagnosis not present

## 2019-11-25 DIAGNOSIS — D2272 Melanocytic nevi of left lower limb, including hip: Secondary | ICD-10-CM | POA: Diagnosis not present

## 2019-11-25 DIAGNOSIS — L570 Actinic keratosis: Secondary | ICD-10-CM | POA: Diagnosis not present

## 2019-11-25 DIAGNOSIS — D2271 Melanocytic nevi of right lower limb, including hip: Secondary | ICD-10-CM | POA: Diagnosis not present

## 2019-11-25 DIAGNOSIS — L578 Other skin changes due to chronic exposure to nonionizing radiation: Secondary | ICD-10-CM | POA: Diagnosis not present

## 2019-11-25 DIAGNOSIS — L988 Other specified disorders of the skin and subcutaneous tissue: Secondary | ICD-10-CM | POA: Diagnosis not present

## 2019-11-25 DIAGNOSIS — L650 Telogen effluvium: Secondary | ICD-10-CM | POA: Diagnosis not present

## 2019-11-25 DIAGNOSIS — Z86018 Personal history of other benign neoplasm: Secondary | ICD-10-CM | POA: Diagnosis not present

## 2019-12-08 MED FILL — HUMIRA PEN 40 MG/0.8ML PNKT: 40 | 28 days supply | Qty: 2 | Fill #2

## 2019-12-31 MED FILL — valACYclovir HCL 1 GM TABS: 1 | 30 days supply | Qty: 30 | Fill #0

## 2020-01-05 MED FILL — HUMIRA PEN 40 MG/0.8ML PNKT: 40 | 28 days supply | Qty: 2 | Fill #3

## 2020-01-11 ENCOUNTER — Other Ambulatory Visit (HOSPITAL_COMMUNITY): Payer: Self-pay | Admitting: Obstetrics and Gynecology

## 2020-01-11 DIAGNOSIS — Z1231 Encounter for screening mammogram for malignant neoplasm of breast: Secondary | ICD-10-CM | POA: Diagnosis not present

## 2020-01-11 DIAGNOSIS — Z01419 Encounter for gynecological examination (general) (routine) without abnormal findings: Secondary | ICD-10-CM | POA: Diagnosis not present

## 2020-01-11 DIAGNOSIS — Z681 Body mass index (BMI) 19 or less, adult: Secondary | ICD-10-CM | POA: Diagnosis not present

## 2020-01-11 MED FILL — PROGESTERONE 100 MG CAPS: 100 | 90 days supply | Qty: 90 | Fill #0

## 2020-01-11 MED FILL — ESTROGEL 0.06% GEL: 0.75 MG/1.2 | 30 days supply | Qty: 50 | Fill #0

## 2020-01-11 MED FILL — AZELASTINE-FLUTICASONE 137-: 137-50 | 30 days supply | Qty: 23 | Fill #1

## 2020-02-04 ENCOUNTER — Other Ambulatory Visit: Payer: Self-pay | Admitting: Pharmacist

## 2020-02-04 MED ORDER — HUMIRA (2 PEN) 40 MG/0.4ML ~~LOC~~ AJKT: AUTO-INJECTOR | SUBCUTANEOUS | 12 refills | Status: DC

## 2020-02-04 MED FILL — HUMIRA PEN 40 MG/0.8ML PNKT: 40 | 28 days supply | Qty: 2 | Fill #4

## 2020-02-07 MED FILL — HUMIRA PEN 40 MG/0.8ML PNKT: 40 | 28 days supply | Qty: 2 | Fill #4

## 2020-02-09 ENCOUNTER — Other Ambulatory Visit: Payer: Self-pay

## 2020-02-09 DIAGNOSIS — R609 Edema, unspecified: Secondary | ICD-10-CM

## 2020-02-09 MED FILL — SYNTHROID 200 MCG TABLET: 200 | 90 days supply | Qty: 90 | Fill #2

## 2020-02-10 LAB — COMPREHENSIVE METABOLIC PANEL
AG Ratio: 1.5 (calc) (ref 1.0–2.5)
ALT: 32 U/L — ABNORMAL HIGH (ref 6–29)
AST: 38 U/L — ABNORMAL HIGH (ref 10–35)
Albumin: 2.9 g/dL — ABNORMAL LOW (ref 3.6–5.1)
Alkaline phosphatase (APISO): 82 U/L (ref 37–153)
BUN: 14 mg/dL (ref 7–25)
CO2: 27 mmol/L (ref 20–32)
Calcium: 8.5 mg/dL — ABNORMAL LOW (ref 8.6–10.4)
Chloride: 111 mmol/L — ABNORMAL HIGH (ref 98–110)
Creat: 0.69 mg/dL (ref 0.50–1.05)
Globulin: 2 g/dL (calc) (ref 1.9–3.7)
Glucose, Bld: 79 mg/dL (ref 65–139)
Potassium: 4.5 mmol/L (ref 3.5–5.3)
Sodium: 141 mmol/L (ref 135–146)
Total Bilirubin: 0.3 mg/dL (ref 0.2–1.2)
Total Protein: 4.9 g/dL — ABNORMAL LOW (ref 6.1–8.1)

## 2020-02-16 DIAGNOSIS — E039 Hypothyroidism, unspecified: Secondary | ICD-10-CM | POA: Diagnosis not present

## 2020-02-16 DIAGNOSIS — M8580 Other specified disorders of bone density and structure, unspecified site: Secondary | ICD-10-CM | POA: Diagnosis not present

## 2020-02-16 DIAGNOSIS — Z23 Encounter for immunization: Secondary | ICD-10-CM | POA: Diagnosis not present

## 2020-02-16 DIAGNOSIS — Z7185 Encounter for immunization safety counseling: Secondary | ICD-10-CM | POA: Diagnosis not present

## 2020-02-16 DIAGNOSIS — R600 Localized edema: Secondary | ICD-10-CM | POA: Diagnosis not present

## 2020-03-06 MED FILL — HUMIRA PEN 40 MG/0.8ML PNKT: 40 | 28 days supply | Qty: 2 | Fill #5

## 2020-03-09 DIAGNOSIS — E559 Vitamin D deficiency, unspecified: Secondary | ICD-10-CM | POA: Diagnosis not present

## 2020-03-09 DIAGNOSIS — M8580 Other specified disorders of bone density and structure, unspecified site: Secondary | ICD-10-CM | POA: Diagnosis not present

## 2020-03-09 DIAGNOSIS — E039 Hypothyroidism, unspecified: Secondary | ICD-10-CM | POA: Diagnosis not present

## 2020-03-15 ENCOUNTER — Other Ambulatory Visit (HOSPITAL_COMMUNITY): Payer: Self-pay | Admitting: Family Medicine

## 2020-03-15 DIAGNOSIS — E611 Iron deficiency: Secondary | ICD-10-CM | POA: Diagnosis not present

## 2020-03-15 DIAGNOSIS — E559 Vitamin D deficiency, unspecified: Secondary | ICD-10-CM | POA: Diagnosis not present

## 2020-03-15 DIAGNOSIS — E039 Hypothyroidism, unspecified: Secondary | ICD-10-CM | POA: Diagnosis not present

## 2020-03-15 MED FILL — SYNTHROID 50 MCG TABLET: 50 | 30 days supply | Qty: 30 | Fill #0

## 2020-03-17 DIAGNOSIS — H52223 Regular astigmatism, bilateral: Secondary | ICD-10-CM | POA: Diagnosis not present

## 2020-03-17 DIAGNOSIS — H524 Presbyopia: Secondary | ICD-10-CM | POA: Diagnosis not present

## 2020-03-24 DIAGNOSIS — K5090 Crohn's disease, unspecified, without complications: Secondary | ICD-10-CM | POA: Diagnosis not present

## 2020-04-11 MED FILL — SYNTHROID 50 MCG TABLET: 50 | 30 days supply | Qty: 30 | Fill #1

## 2020-04-13 ENCOUNTER — Other Ambulatory Visit: Payer: Self-pay | Admitting: Pharmacist

## 2020-04-13 MED ORDER — HUMIRA (2 PEN) 80 MG/0.8ML ~~LOC~~ PNKT: PEN_INJECTOR | SUBCUTANEOUS | 6 refills | Status: DC

## 2020-04-13 MED ORDER — HUMIRA PEN 40 MG/0.8ML ~~LOC~~ PNKT: PEN_INJECTOR | SUBCUTANEOUS | 6 refills | Status: DC

## 2020-04-17 ENCOUNTER — Other Ambulatory Visit (HOSPITAL_COMMUNITY): Payer: Self-pay | Admitting: Endocrinology

## 2020-04-17 DIAGNOSIS — L659 Nonscarring hair loss, unspecified: Secondary | ICD-10-CM | POA: Diagnosis not present

## 2020-04-17 DIAGNOSIS — M8580 Other specified disorders of bone density and structure, unspecified site: Secondary | ICD-10-CM | POA: Diagnosis not present

## 2020-04-17 DIAGNOSIS — C73 Malignant neoplasm of thyroid gland: Secondary | ICD-10-CM | POA: Diagnosis not present

## 2020-04-17 DIAGNOSIS — E890 Postprocedural hypothyroidism: Secondary | ICD-10-CM | POA: Diagnosis not present

## 2020-04-17 DIAGNOSIS — K5090 Crohn's disease, unspecified, without complications: Secondary | ICD-10-CM | POA: Diagnosis not present

## 2020-04-17 DIAGNOSIS — E611 Iron deficiency: Secondary | ICD-10-CM | POA: Diagnosis not present

## 2020-04-17 MED FILL — HUMIRA PEN 40 MG/0.8ML PNKT: 40 | 28 days supply | Qty: 2 | Fill #0

## 2020-04-17 MED FILL — INTEGRA PLUS CAPSULE: 30 days supply | Qty: 30 | Fill #0

## 2020-04-26 DIAGNOSIS — L570 Actinic keratosis: Secondary | ICD-10-CM | POA: Diagnosis not present

## 2020-04-26 DIAGNOSIS — D485 Neoplasm of uncertain behavior of skin: Secondary | ICD-10-CM | POA: Diagnosis not present

## 2020-04-26 DIAGNOSIS — L309 Dermatitis, unspecified: Secondary | ICD-10-CM | POA: Diagnosis not present

## 2020-05-05 ENCOUNTER — Other Ambulatory Visit: Payer: Self-pay | Admitting: Pharmacist

## 2020-05-05 MED ORDER — HUMIRA (2 PEN) 40 MG/0.4ML ~~LOC~~ AJKT: AUTO-INJECTOR | SUBCUTANEOUS | 3 refills | Status: DC

## 2020-05-09 MED FILL — HUMIRA PEN 40 MG/0.8ML PNKT: 40 | 28 days supply | Qty: 2 | Fill #1

## 2020-05-10 MED FILL — AZELASTINE-FLUTICASONE 137-: 137-50 | 30 days supply | Qty: 23 | Fill #2

## 2020-05-10 MED FILL — SYNTHROID 200 MCG TABLET: 200 | 90 days supply | Qty: 90 | Fill #0

## 2020-05-16 MED FILL — INTEGRA PLUS CAPSULE: 30 days supply | Qty: 30 | Fill #1

## 2020-05-29 DIAGNOSIS — E890 Postprocedural hypothyroidism: Secondary | ICD-10-CM | POA: Diagnosis not present

## 2020-05-29 DIAGNOSIS — C73 Malignant neoplasm of thyroid gland: Secondary | ICD-10-CM | POA: Diagnosis not present

## 2020-05-29 DIAGNOSIS — E611 Iron deficiency: Secondary | ICD-10-CM | POA: Diagnosis not present

## 2020-06-06 MED FILL — HUMIRA PEN 40 MG/0.8ML PNKT: 40 | 28 days supply | Qty: 2 | Fill #2

## 2020-06-26 ENCOUNTER — Other Ambulatory Visit (HOSPITAL_COMMUNITY): Payer: Self-pay | Admitting: Gastroenterology

## 2020-06-26 ENCOUNTER — Other Ambulatory Visit: Payer: Self-pay | Admitting: Gastroenterology

## 2020-06-26 DIAGNOSIS — K50919 Crohn's disease, unspecified, with unspecified complications: Secondary | ICD-10-CM

## 2020-06-26 DIAGNOSIS — K5090 Crohn's disease, unspecified, without complications: Secondary | ICD-10-CM | POA: Diagnosis not present

## 2020-06-26 DIAGNOSIS — R1010 Upper abdominal pain, unspecified: Secondary | ICD-10-CM

## 2020-06-28 DIAGNOSIS — D485 Neoplasm of uncertain behavior of skin: Secondary | ICD-10-CM | POA: Diagnosis not present

## 2020-06-28 DIAGNOSIS — L570 Actinic keratosis: Secondary | ICD-10-CM | POA: Diagnosis not present

## 2020-06-28 DIAGNOSIS — L578 Other skin changes due to chronic exposure to nonionizing radiation: Secondary | ICD-10-CM | POA: Diagnosis not present

## 2020-06-28 DIAGNOSIS — D2272 Melanocytic nevi of left lower limb, including hip: Secondary | ICD-10-CM | POA: Diagnosis not present

## 2020-06-28 DIAGNOSIS — D225 Melanocytic nevi of trunk: Secondary | ICD-10-CM | POA: Diagnosis not present

## 2020-06-28 DIAGNOSIS — Z86018 Personal history of other benign neoplasm: Secondary | ICD-10-CM | POA: Diagnosis not present

## 2020-06-28 DIAGNOSIS — D2271 Melanocytic nevi of right lower limb, including hip: Secondary | ICD-10-CM | POA: Diagnosis not present

## 2020-06-28 DIAGNOSIS — Z85828 Personal history of other malignant neoplasm of skin: Secondary | ICD-10-CM | POA: Diagnosis not present

## 2020-07-05 MED FILL — HUMIRA PEN 40 MG/0.8ML PNKT: 40 | 28 days supply | Qty: 2 | Fill #3

## 2020-07-06 ENCOUNTER — Other Ambulatory Visit (HOSPITAL_COMMUNITY): Payer: Self-pay | Admitting: Podiatry

## 2020-07-06 ENCOUNTER — Other Ambulatory Visit (HOSPITAL_COMMUNITY): Payer: Self-pay

## 2020-07-06 ENCOUNTER — Other Ambulatory Visit: Payer: Self-pay | Admitting: *Deleted

## 2020-07-06 MED ORDER — AZITHROMYCIN 250 MG PO TABS
ORAL_TABLET | ORAL | 0 refills | Status: DC
Start: 2020-07-06 — End: 2020-10-25

## 2020-07-06 MED FILL — AZITHROMYCIN 250 MG TABLET: 250 | 5 days supply | Qty: 6 | Fill #0

## 2020-07-06 MED FILL — ALPRAZolam 0.5 MG TABS: 0.5 | 20 days supply | Qty: 20 | Fill #0

## 2020-07-13 ENCOUNTER — Other Ambulatory Visit (HOSPITAL_COMMUNITY): Payer: Self-pay

## 2020-07-13 DIAGNOSIS — R32 Unspecified urinary incontinence: Secondary | ICD-10-CM | POA: Diagnosis not present

## 2020-07-13 DIAGNOSIS — N951 Menopausal and female climacteric states: Secondary | ICD-10-CM | POA: Diagnosis not present

## 2020-07-13 DIAGNOSIS — R3981 Functional urinary incontinence: Secondary | ICD-10-CM | POA: Diagnosis not present

## 2020-07-18 ENCOUNTER — Other Ambulatory Visit: Payer: Self-pay

## 2020-07-18 ENCOUNTER — Ambulatory Visit
Admission: RE | Admit: 2020-07-18 | Discharge: 2020-07-18 | Disposition: A | Discharge status: Home / Self Care | Payer: 59 | Source: Ambulatory Visit | Attending: Gastroenterology | Admitting: Gastroenterology

## 2020-07-18 DIAGNOSIS — R1010 Upper abdominal pain, unspecified: Secondary | ICD-10-CM

## 2020-07-18 DIAGNOSIS — K56699 Other intestinal obstruction unspecified as to partial versus complete obstruction: Secondary | ICD-10-CM | POA: Diagnosis not present

## 2020-07-18 DIAGNOSIS — K6389 Other specified diseases of intestine: Secondary | ICD-10-CM | POA: Diagnosis not present

## 2020-07-18 DIAGNOSIS — K50919 Crohn's disease, unspecified, with unspecified complications: Secondary | ICD-10-CM

## 2020-07-18 MED ORDER — IOPAMIDOL (ISOVUE-300) INJECTION 61%
100.0000 mL | Freq: Once | INTRAVENOUS | Status: AC | PRN
  Administered 2020-07-18: 100 mL via INTRAVENOUS

## 2020-07-20 ENCOUNTER — Other Ambulatory Visit (HOSPITAL_COMMUNITY): Payer: Self-pay

## 2020-07-20 DIAGNOSIS — D485 Neoplasm of uncertain behavior of skin: Secondary | ICD-10-CM | POA: Diagnosis not present

## 2020-07-20 DIAGNOSIS — C44311 Basal cell carcinoma of skin of nose: Secondary | ICD-10-CM | POA: Diagnosis not present

## 2020-07-20 MED FILL — Fe Fum-Iron Polysacch Complex-FA-B Complex-C-Biotin Cap: ORAL | 30 days supply | Qty: 30 | Fill #0 | Status: AC

## 2020-07-21 ENCOUNTER — Other Ambulatory Visit (HOSPITAL_COMMUNITY): Payer: Self-pay

## 2020-07-24 ENCOUNTER — Other Ambulatory Visit (HOSPITAL_COMMUNITY): Payer: Self-pay

## 2020-07-24 MED FILL — Adalimumab Auto-injector Kit 40 MG/0.4ML: SUBCUTANEOUS | 28 days supply | Qty: 2 | Fill #0 | Status: CN

## 2020-07-27 ENCOUNTER — Other Ambulatory Visit (HOSPITAL_COMMUNITY): Payer: Self-pay

## 2020-07-28 ENCOUNTER — Other Ambulatory Visit: Payer: Self-pay | Admitting: Podiatry

## 2020-07-28 ENCOUNTER — Other Ambulatory Visit (HOSPITAL_COMMUNITY): Payer: Self-pay

## 2020-07-28 MED ORDER — PREDNISONE 10 MG (48) PO TBPK: ORAL_TABLET | ORAL | 0 refills | Status: DC

## 2020-07-28 MED ORDER — PREDNISONE 20 MG PO TABS
20.0000 mg | ORAL_TABLET | Freq: Every day | ORAL | 2 refills | Status: DC
  Filled 2020-07-28: qty 30, 30d supply, fill #0

## 2020-08-01 ENCOUNTER — Other Ambulatory Visit: Payer: Self-pay | Admitting: Pharmacist

## 2020-08-01 ENCOUNTER — Other Ambulatory Visit (HOSPITAL_COMMUNITY): Payer: Self-pay

## 2020-08-01 MED ORDER — HUMIRA PEN 40 MG/0.8ML ~~LOC~~ PNKT
PEN_INJECTOR | SUBCUTANEOUS | 6 refills | Status: DC
  Filled 2020-08-01: qty 2, 28d supply, fill #0
  Filled 2020-09-01: qty 2, 28d supply, fill #1

## 2020-08-01 MED ORDER — HUMIRA PEN 40 MG/0.8ML ~~LOC~~ PNKT: PEN_INJECTOR | SUBCUTANEOUS | 6 refills | Status: DC

## 2020-08-09 DIAGNOSIS — K5000 Crohn's disease of small intestine without complications: Secondary | ICD-10-CM | POA: Diagnosis not present

## 2020-08-09 DIAGNOSIS — K5090 Crohn's disease, unspecified, without complications: Secondary | ICD-10-CM | POA: Diagnosis not present

## 2020-08-10 ENCOUNTER — Other Ambulatory Visit (HOSPITAL_COMMUNITY): Payer: Self-pay

## 2020-08-10 DIAGNOSIS — K5090 Crohn's disease, unspecified, without complications: Secondary | ICD-10-CM | POA: Diagnosis not present

## 2020-08-10 DIAGNOSIS — J3089 Other allergic rhinitis: Secondary | ICD-10-CM | POA: Diagnosis not present

## 2020-08-10 DIAGNOSIS — J301 Allergic rhinitis due to pollen: Secondary | ICD-10-CM | POA: Diagnosis not present

## 2020-08-10 DIAGNOSIS — H1045 Other chronic allergic conjunctivitis: Secondary | ICD-10-CM | POA: Diagnosis not present

## 2020-08-10 DIAGNOSIS — K5000 Crohn's disease of small intestine without complications: Secondary | ICD-10-CM | POA: Diagnosis not present

## 2020-08-10 DIAGNOSIS — J3081 Allergic rhinitis due to animal (cat) (dog) hair and dander: Secondary | ICD-10-CM | POA: Diagnosis not present

## 2020-08-10 MED ORDER — LEVOCETIRIZINE DIHYDROCHLORIDE 5 MG PO TABS
ORAL_TABLET | ORAL | 5 refills | Status: AC
  Filled 2020-08-10: qty 30, 30d supply, fill #0

## 2020-08-10 MED ORDER — AZELASTINE-FLUTICASONE 137-50 MCG/ACT NA SUSP
NASAL | 5 refills | Status: DC
  Filled 2020-08-10: qty 23, 30d supply, fill #0

## 2020-08-11 ENCOUNTER — Other Ambulatory Visit (HOSPITAL_COMMUNITY): Payer: Self-pay

## 2020-08-16 DIAGNOSIS — E611 Iron deficiency: Secondary | ICD-10-CM | POA: Diagnosis not present

## 2020-08-16 DIAGNOSIS — M8580 Other specified disorders of bone density and structure, unspecified site: Secondary | ICD-10-CM | POA: Diagnosis not present

## 2020-08-16 DIAGNOSIS — E890 Postprocedural hypothyroidism: Secondary | ICD-10-CM | POA: Diagnosis not present

## 2020-08-18 ENCOUNTER — Other Ambulatory Visit (HOSPITAL_COMMUNITY): Payer: Self-pay

## 2020-08-18 MED FILL — Levothyroxine Sodium Tab 200 MCG: ORAL | 90 days supply | Qty: 90 | Fill #0 | Status: AC

## 2020-08-18 MED FILL — Estradiol Gel 0.06% (0.75 MG/1.25 GM Metered-Dose Pump): TRANSDERMAL | 30 days supply | Qty: 50 | Fill #0 | Status: AC

## 2020-08-21 ENCOUNTER — Other Ambulatory Visit (HOSPITAL_COMMUNITY): Payer: Self-pay

## 2020-08-21 DIAGNOSIS — Z7689 Persons encountering health services in other specified circumstances: Secondary | ICD-10-CM | POA: Diagnosis not present

## 2020-08-22 ENCOUNTER — Other Ambulatory Visit (HOSPITAL_COMMUNITY): Payer: Self-pay

## 2020-08-22 MED ORDER — LEVOTHYROXINE SODIUM 50 MCG PO TABS
ORAL_TABLET | ORAL | 4 refills | Status: DC
  Filled 2020-08-22: qty 12, 84d supply, fill #0
  Filled 2020-12-19: qty 12, 84d supply, fill #1

## 2020-08-31 DIAGNOSIS — K50012 Crohn's disease of small intestine with intestinal obstruction: Secondary | ICD-10-CM | POA: Diagnosis not present

## 2020-09-01 ENCOUNTER — Other Ambulatory Visit (HOSPITAL_COMMUNITY): Payer: Self-pay

## 2020-09-05 ENCOUNTER — Other Ambulatory Visit (HOSPITAL_COMMUNITY): Payer: Self-pay

## 2020-09-06 DIAGNOSIS — K5010 Crohn's disease of large intestine without complications: Secondary | ICD-10-CM | POA: Diagnosis not present

## 2020-09-06 DIAGNOSIS — K50012 Crohn's disease of small intestine with intestinal obstruction: Secondary | ICD-10-CM | POA: Diagnosis not present

## 2020-09-06 DIAGNOSIS — K6389 Other specified diseases of intestine: Secondary | ICD-10-CM | POA: Diagnosis not present

## 2020-09-26 ENCOUNTER — Other Ambulatory Visit (HOSPITAL_COMMUNITY): Payer: Self-pay

## 2020-09-28 ENCOUNTER — Other Ambulatory Visit (HOSPITAL_COMMUNITY): Payer: Self-pay

## 2020-09-28 ENCOUNTER — Telehealth: Payer: Self-pay | Admitting: Pharmacist

## 2020-09-28 MED ORDER — NEOMYCIN SULFATE 500 MG PO TABS
ORAL_TABLET | ORAL | 0 refills | Status: DC
  Filled 2020-09-28 – 2020-10-05 (×2): qty 6, 1d supply, fill #0

## 2020-09-28 MED ORDER — ERYTHROMYCIN BASE 500 MG PO TABS
ORAL_TABLET | ORAL | 0 refills | Status: DC
  Filled 2020-09-28: qty 6, 1d supply, fill #0

## 2020-09-28 NOTE — Telephone Encounter (Signed)
Called patient to schedule an appointment for the [employer] [facility]. I was unable to reach the patient so I left a HIPAA-compliant message requesting that the patient return my call.   Luke Van Ausdall, PharmD, BCACP, CPP Clinical Pharmacist [facility] [phone]  

## 2020-09-29 ENCOUNTER — Other Ambulatory Visit (HOSPITAL_COMMUNITY): Payer: Self-pay

## 2020-09-29 MED ORDER — ESCITALOPRAM OXALATE 10 MG PO TABS
ORAL_TABLET | ORAL | 3 refills | Status: DC
  Filled 2020-09-29: qty 90, 90d supply, fill #0
  Filled 2021-01-11: qty 90, 90d supply, fill #1
  Filled 2021-04-13: qty 90, 90d supply, fill #2
  Filled 2021-07-01: qty 90, 90d supply, fill #3

## 2020-10-05 ENCOUNTER — Other Ambulatory Visit (HOSPITAL_COMMUNITY): Payer: Self-pay

## 2020-10-05 MED FILL — Progesterone Cap 100 MG: ORAL | 90 days supply | Qty: 90 | Fill #0 | Status: AC

## 2020-10-06 ENCOUNTER — Other Ambulatory Visit (HOSPITAL_COMMUNITY): Payer: Self-pay

## 2020-10-06 DIAGNOSIS — K50012 Crohn's disease of small intestine with intestinal obstruction: Secondary | ICD-10-CM | POA: Diagnosis not present

## 2020-10-06 MED ORDER — ERYTHROMYCIN BASE 500 MG PO TABS
ORAL_TABLET | ORAL | 0 refills | Status: DC
  Filled 2020-10-06: qty 6, 1d supply, fill #0

## 2020-10-10 ENCOUNTER — Other Ambulatory Visit (HOSPITAL_COMMUNITY): Payer: Self-pay

## 2020-10-11 DIAGNOSIS — C179 Malignant neoplasm of small intestine, unspecified: Secondary | ICD-10-CM

## 2020-10-11 DIAGNOSIS — D638 Anemia in other chronic diseases classified elsewhere: Secondary | ICD-10-CM | POA: Diagnosis not present

## 2020-10-11 DIAGNOSIS — G8918 Other acute postprocedural pain: Secondary | ICD-10-CM | POA: Diagnosis not present

## 2020-10-11 DIAGNOSIS — K50012 Crohn's disease of small intestine with intestinal obstruction: Secondary | ICD-10-CM | POA: Diagnosis not present

## 2020-10-11 DIAGNOSIS — C171 Malignant neoplasm of jejunum: Secondary | ICD-10-CM | POA: Diagnosis not present

## 2020-10-11 DIAGNOSIS — Z9882 Breast implant status: Secondary | ICD-10-CM | POA: Diagnosis not present

## 2020-10-11 DIAGNOSIS — E890 Postprocedural hypothyroidism: Secondary | ICD-10-CM | POA: Diagnosis not present

## 2020-10-11 HISTORY — DX: Malignant neoplasm of small intestine, unspecified: C17.9

## 2020-10-12 ENCOUNTER — Other Ambulatory Visit (HOSPITAL_COMMUNITY): Payer: Self-pay

## 2020-10-18 ENCOUNTER — Other Ambulatory Visit: Payer: Self-pay | Admitting: *Deleted

## 2020-10-18 NOTE — Patient Outreach (Signed)
[facility]) Care Management  10/18/2020  JILLENE WEHRENBERG 23-Sep-1960 [id number]   Transition of care telephone call  Referral received:10/11/20 Initial outreach:10/18/20 Insurance: [facility]   Initial unsuccessful telephone call to patient's preferred number in order to complete transition of care assessment; no answer, left HIPAA compliant voicemail message requesting return call.   Objective: Per the electronic medical record, Mrs. Jody Chen  was hospitalized at  [facility]  7/6-7/11/22 for Segmental small bowel resection. Comorbidities include: Crohn's Disease, She was discharged to home on 10/16/20  without the need for home health services or durable medical equipment per the discharge summary.   Plan: This RNCM will route unsuccessful outreach letter with [place] Management pamphlet and 24 hour Nurse Advice Line Magnet to [place] Management clinical pool to be mailed to patient's home address. This RNCM will attempt another outreach within 4 business days.   Joylene Draft, RN, BSN  [place] Management Coordinator  [phone]- Mobile [phone]- Toll Free Main Office

## 2020-10-23 ENCOUNTER — Other Ambulatory Visit: Payer: Self-pay | Admitting: *Deleted

## 2020-10-23 NOTE — Patient Outreach (Signed)
[facility]) Care Management  10/23/2020  WILLMA OBANDO [birth year] [id number]  Transition of care call Referral received: 10/11/20 Initial outreach attempt: 10/18/20 Insurance: [place]    2nd unsuccessful telephone call to patient's preferred contact number in order to complete post hospital discharge transition of care assessment , no answer left HIPAA compliant message requesting return call.    Objective: Per the electronic medical record, Mrs. Panhia Karl  was hospitalized at  [facility]  7/6-7/11/22 for Segmental small bowel resection. Comorbidities include: Crohn's Disease, She was discharged to home on 10/16/20  without the need for home health services or durable medical equipment per the discharge summary. Plan If no return call from patient will attempt 3rd outreach in the next 3 to 4 business days.   Joylene Draft, RN, BSN  [place] Management Coordinator  [phone]- Mobile [phone]- Toll Free Main Office

## 2020-10-24 DIAGNOSIS — C179 Malignant neoplasm of small intestine, unspecified: Secondary | ICD-10-CM | POA: Insufficient documentation

## 2020-10-24 NOTE — Progress Notes (Signed)
I spoke with Dr Paulla Dolly letting him know Mrs Dols has been scheduled with Dr Burr Medico 10/25/2020 at 1100 arrival time 1045.  He verbalized understanding.

## 2020-10-25 ENCOUNTER — Other Ambulatory Visit: Payer: Self-pay

## 2020-10-25 ENCOUNTER — Encounter: Payer: Self-pay | Admitting: Hematology

## 2020-10-25 ENCOUNTER — Inpatient Hospital Stay: Payer: 59

## 2020-10-25 ENCOUNTER — Inpatient Hospital Stay: Payer: 59 | Attending: Hematology | Admitting: Hematology

## 2020-10-25 ENCOUNTER — Other Ambulatory Visit: Payer: Self-pay | Admitting: *Deleted

## 2020-10-25 VITALS — BP 107/71 | HR 75 | Temp 98.6°F | Resp 20 | Ht 69.5 in | Wt 124.3 lb

## 2020-10-25 DIAGNOSIS — C171 Malignant neoplasm of jejunum: Secondary | ICD-10-CM | POA: Diagnosis not present

## 2020-10-25 DIAGNOSIS — Z85850 Personal history of malignant neoplasm of thyroid: Secondary | ICD-10-CM | POA: Insufficient documentation

## 2020-10-25 DIAGNOSIS — D500 Iron deficiency anemia secondary to blood loss (chronic): Secondary | ICD-10-CM

## 2020-10-25 DIAGNOSIS — C179 Malignant neoplasm of small intestine, unspecified: Secondary | ICD-10-CM

## 2020-10-25 DIAGNOSIS — K5090 Crohn's disease, unspecified, without complications: Secondary | ICD-10-CM | POA: Diagnosis not present

## 2020-10-25 DIAGNOSIS — D638 Anemia in other chronic diseases classified elsewhere: Secondary | ICD-10-CM | POA: Insufficient documentation

## 2020-10-25 LAB — FERRITIN: Ferritin: 5 ng/mL — ABNORMAL LOW (ref 11–307)

## 2020-10-25 LAB — CBC WITH DIFFERENTIAL (CANCER CENTER ONLY)
Abs Immature Granulocytes: 0.02 10*3/uL (ref 0.00–0.07)
Basophils Absolute: 0 10*3/uL (ref 0.0–0.1)
Basophils Relative: 0 %
Eosinophils Absolute: 0.2 10*3/uL (ref 0.0–0.5)
Eosinophils Relative: 2 %
HCT: 30.6 % — ABNORMAL LOW (ref 36.0–46.0)
Hemoglobin: 9.4 g/dL — ABNORMAL LOW (ref 12.0–15.0)
Immature Granulocytes: 0 %
Lymphocytes Relative: 21 %
Lymphs Abs: 1.6 10*3/uL (ref 0.7–4.0)
MCH: 25.1 pg — ABNORMAL LOW (ref 26.0–34.0)
MCHC: 30.7 g/dL (ref 30.0–36.0)
MCV: 81.8 fL (ref 80.0–100.0)
Monocytes Absolute: 0.5 10*3/uL (ref 0.1–1.0)
Monocytes Relative: 7 %
Neutro Abs: 5.3 10*3/uL (ref 1.7–7.7)
Neutrophils Relative %: 70 %
Platelet Count: 434 10*3/uL — ABNORMAL HIGH (ref 150–400)
RBC: 3.74 MIL/uL — ABNORMAL LOW (ref 3.87–5.11)
RDW: 16 % — ABNORMAL HIGH (ref 11.5–15.5)
WBC Count: 7.7 10*3/uL (ref 4.0–10.5)
nRBC: 0 % (ref 0.0–0.2)

## 2020-10-25 LAB — CMP (CANCER CENTER ONLY)
ALT: 36 U/L (ref 0–44)
AST: 20 U/L (ref 15–41)
Albumin: 3.8 g/dL (ref 3.5–5.0)
Alkaline Phosphatase: 90 U/L (ref 38–126)
Anion gap: 10 (ref 5–15)
BUN: 17 mg/dL (ref 6–20)
CO2: 26 mmol/L (ref 22–32)
Calcium: 9.3 mg/dL (ref 8.9–10.3)
Chloride: 106 mmol/L (ref 98–111)
Creatinine: 0.62 mg/dL (ref 0.44–1.00)
GFR, Estimated: >60 mL/min (ref >60)
Glucose, Bld: 79 mg/dL (ref 70–99)
Potassium: 4.3 mmol/L (ref 3.5–5.1)
Sodium: 142 mmol/L (ref 135–145)
Total Bilirubin: 0.6 mg/dL (ref 0.3–1.2)
Total Protein: 6.8 g/dL (ref 6.5–8.1)

## 2020-10-25 LAB — IRON AND TIBC
Iron: 18 ug/dL — ABNORMAL LOW (ref 41–142)
Saturation Ratios: 4 % — ABNORMAL LOW (ref 21–57)
TIBC: 481 ug/dL — ABNORMAL HIGH (ref 236–444)
UIBC: 463 ug/dL — ABNORMAL HIGH (ref 120–384)

## 2020-10-25 LAB — CEA (IN HOUSE-CHCC): CEA (CHCC-In House): 2.26 ng/mL (ref 0.00–5.00)

## 2020-10-25 NOTE — Progress Notes (Signed)
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 female is here because of her recently diagnosed small bowel cancer.  She presents to clinic accompanied by her husband Dr. Paulla Dolly, who is a podiatrist in our [place].   She has had Crohn disease since 32's, she was previously under the care of GI Dr. Watt Climes but recently switched to Dr. Renee Harder at [facility] in May 2022. She was previously on Humira. Since about one and a half years ago, she has developed intermittent severe abdominal pain/cramps, nausea, vomiting episodes, which she felt to be flare of her Crohn's disease, it usually last a few days, and she took steroids which helped. It happens once every a few days.  She had a CT on 07/18/2020, which showed dilated segment of distal small bowel in the ventral abdomen, the longest of measures 10 cm, consistent with Crohn's disease complicated by small bowel strictures.  CT scan also showed mild wall thickening throughout the colon.  So she underwent colonoscopy which was negative.  She was seen by Dr. Renee Harder and Dr. Morton Stall in their IBD clinic in May 2022, surgery versus medical treatment with Stelara were discussed with her. She finally decided to have surgery after and is episodes in June.  She was admitted on October 11, 2020 for small bowel resection due to strictures by Dr. Morton Stall at [facility].  Pathology unfortunately showed medullary poorly differentiated carcinoma, 10 cm, T3 lesion,  surgical margins were negative, 12 lymph nodes were negative.   She has recovered well from surgery, she actually feels better overall since her surgery.  She is eating well, no other new complaints.  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    MEDICAL HISTORY:  Past Medical History:  Diagnosis Date   Abdominal distention    Abdominal pain    Cancer ([place])    thyroid   Constipation    Crohn's    Migraines    Thyroid disease     SURGICAL HISTORY: Past Surgical History:  Procedure Laterality Date   BREAST SURGERY     THYROIDECTOMY, PARTIAL  8/94    SOCIAL HISTORY: Social History   Socioeconomic History   Marital status: Married    Spouse name: Not on file   Number of children: Not on file   Years of education: Not on file   Highest education level: Not on file  Occupational History   Not on file  Tobacco Use   Smoking status: Never   Smokeless tobacco: Never  Substance and Sexual Activity   Alcohol use: Yes    Comment: 1 - 2 glasses weekly   Drug use: No   Sexual activity: Not on file  Other Topics Concern   Not on file  Social History Narrative   Not on file   Social Determinants of Health   Financial  Resource Strain: Not on file  Food Insecurity: Not on file  Transportation Needs: Not on file  Physical Activity: Not on file  Stress: Not on file  Social Connections: Not on file  Intimate Partner Violence: Not on file    FAMILY HISTORY: History reviewed. No pertinent family history.  ALLERGIES:  is allergic to butorphanol and sulfa antibiotics.  MEDICATIONS:  Current Outpatient Medications  Medication Sig Dispense Refill   ALPRAZolam (XANAX) 0.5 MG tablet alprazolam 0.5 mg tablet     ALPRAZolam (XANAX) 0.5 MG tablet TAKE 1 TABLET BY MOUTH ONCE DAILY AS NEEDED FOR ANXIETY. 20  tablet 1   alprazolam (XANAX) 2 MG tablet alprazolam 2 mg tablet  TAKE 1/2 TABLET ORALLY EVERY 8 HOURS AS NEEDED FOR ANXIETY     Azelastine-Fluticasone 137-50 MCG/ACT SUSP azelastine-fluticasone 137 mcg-50 mcg/spray nasal spray     Azelastine-Fluticasone 137-50 MCG/ACT SUSP USE 1 SPRAY IN EACH NOSTRIL TWO TIMES DAILY 23 g 5   Azelastine-Fluticasone 137-50 MCG/ACT SUSP 1 spray in each nostril Nasally Twice a day 30 day(s) 23 g 5   escitalopram (LEXAPRO) 10 MG tablet Take 1 tablet by mouth once a day 90 tablet 3   Estradiol (ESTROGEL) 0.75 MG/1.25 GM (0.06%) topical gel EstroGel 1.25 gram/actuation (0.06%) transdermal gel pump     Estradiol 0.75 MG/1.25 GM (0.06%) topical gel APPLY 1 PUMP EVERY DAY AS DIRECTED 50 g 3   Estradiol 0.75 MG/1.25 GM (0.06%) topical gel APPLY 1 PUMP EVERY DAY 150 g 3   FeFum-FePoly-FA-B Cmp-C-Biot (INTEGRA PLUS) CAPS TAKE 1 CAPSULE BY MOUTH ONCE A DAY 30 capsule 6   levocetirizine (XYZAL) 5 MG tablet Take 1 tablet by mouth once daily 30 tablet 5   levothyroxine (SYNTHROID) 50 MCG tablet TAKE 1 TABLET BY MOUTH EVERY MORNING ON AN EMPTY STOMACH 90 tablet 0   levothyroxine (SYNTHROID) 50 MCG tablet Take 1 tablet by mouth on Sunday mornings on an empty stomach. 12 tablet 4   levothyroxine (SYNTHROID, LEVOTHROID) 175 MCG tablet Take 200 mcg by mouth daily.      mesalamine (LIALDA) 1.2 g EC tablet Take 1.2 g by mouth daily with breakfast.     progesterone (PROMETRIUM) 100 MG capsule      progesterone (PROMETRIUM) 100 MG capsule TAKE 1 CAPSULE BY MOUTH ONCE DAILY AT BEDTIME 90 capsule 3   SUMAtriptan (IMITREX) 100 MG tablet      SUMAtriptan (IMITREX) 50 MG tablet Imitrex 50 mg tablet  Take 1 tablet every 6 hours by oral route as needed for headache.     SYNTHROID 200 MCG tablet      SYNTHROID 200 MCG tablet TAKE 1 TABLET BY MOUTH IN THE MORNING ON AN EMPTY STOMACH 90 tablet 1   SYNTHROID 200 MCG tablet TAKE 1 TABLET BY MOUTH IN THE MORNING ON AN EMPTY STOMACH 30 tablet 11    terbinafine (LAMISIL) 250 MG tablet Take 1 tablet (250 mg total) by mouth daily. 60 tablet 0   testosterone (ANDROGEL) 50 MG/5GM (1%) GEL Place 5 g onto the skin daily.     zolpidem (AMBIEN) 10 MG tablet Ambien 10 mg tablet  Take 1 tablet by oral route at bedtime.     No current facility-administered medications for this visit.    REVIEW OF SYSTEMS:   Constitutional: Denies fevers, chills or abnormal night sweats Eyes: Denies blurriness of vision, double vision or watery eyes Ears, nose, mouth, throat, and face: Denies mucositis or sore throat Respiratory: Denies cough, dyspnea or wheezes Cardiovascular:  Denies palpitation, chest discomfort or lower extremity swelling Gastrointestinal:  Denies nausea, heartburn or change in bowel habits Skin: Denies abnormal skin rashes Lymphatics: Denies new lymphadenopathy or easy bruising Neurological:Denies numbness, tingling or new weaknesses Behavioral/Psych: Mood is stable, no new changes  All other systems were reviewed with the patient and are negative.  PHYSICAL EXAMINATION: ECOG PERFORMANCE STATUS: 1 - Symptomatic but completely ambulatory  Vitals:   10/25/20 1105  BP: 107/71  Pulse: 75  Resp: 20  Temp: 98.6 F (37 C)  SpO2: 100%   Filed Weights   10/25/20 1105  Weight: 124 lb 4.8 oz (56.4 kg)    GENERAL:alert, no distress and comfortable SKIN: skin color, texture, turgor are normal, no rashes or significant lesions EYES: normal, conjunctiva are pink and non-injected, sclera clear NECK: supple, thyroid normal size, non-tender, without nodularity LYMPH:  no palpable lymphadenopathy in the cervical, axillary or inguinal LUNGS: clear to auscultation and percussion with normal breathing effort HEART: regular rate & rhythm and no murmurs and no lower extremity edema ABDOMEN:abdomen soft, upper midline incision has healed well well.  Moderate tenderness, no organomegaly, normal bowel sounds Musculoskeletal:no cyanosis of digits and  no clubbing  PSYCH: alert & oriented x 3 with fluent speech NEURO: no focal motor/sensory deficits  LABORATORY DATA:  I have reviewed the data as listed CBC Latest Ref Rng & Units 10/25/2020  WBC 4.0 - 10.5 K/uL 7.7  Hemoglobin 12.0 - 15.0 g/dL 9.4(L)  Hematocrit 36.0 - 46.0 % 30.6(L)  Platelets 150 - 400 K/uL 434(H)   CMP Latest Ref Rng & Units 10/25/2020 02/09/2020  Glucose 70 - 99 mg/dL 79 79  BUN 6 - 20 mg/dL 17 14  Creatinine 0.44 - 1.00 mg/dL 0.62 0.69  Sodium 135 - 145 mmol/L 142 141  Potassium 3.5 - 5.1 mmol/L 4.3 4.5  Chloride 98 - 111 mmol/L 106 111(H)  CO2 22 - 32 mmol/L 26 27  Calcium 8.9 - 10.3 mg/dL 9.3 8.5(L)  Total Protein 6.5 - 8.1 g/dL 6.8 4.9(L)  Total Bilirubin 0.3 - 1.2 mg/dL 0.6 0.3  Alkaline Phos 38 - 126 U/L 90 -  AST 15 - 41 U/L 20 38(H)  ALT 0 - 44 U/L 36 32(H)     RADIOGRAPHIC STUDIES: I have personally reviewed the radiological images as listed and agreed with the findings in the report. No results found.  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  -she had CT abdomen pelvis in April 2022, which showed no evidence of metastatic disease.  -To complete staging, I recommend a PET scan.  If insurance denies PET scan, will get a CT chest, abdomen pelvis with contrast.  We discussed that PET scan is bit more sensitive than CT scan -If PET scan is negative for metastasis, she had stage IIa small bowel cancer.  She has no other high risk features, such as T4 disease, bowel perforation, lymphovascular invasion, perineural invasion, etc. her only high risk is poorly  differentiated carcinoma.  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 MSI-H disease is a good prognostic factor, which predicts less chance of recurrence.  It  also predicts excellent response to immunotherapy if she has recurrent disease. -We discussed that small bowel carcinoma with mandibular features is very rare.  Based on the literature case report, it is usually happens in celiac disease, and commonly  features with MSI high disease -We again discussed the small bowel cancer is rare overall, and we do not have randomized clinical trial to evaluate the benefit of adjuvant chemotherapy, however we do treated similar to colon cancer. -Given the stage IIa disease, no significant high risk features, I do not recommend adjuvant chemotherapy.  The role of adjuvant immunotherapy such as Beryle Flock is currently being evaluated in the adjuvant setting in MSI-H colon cancer and we do not have finally data yet. So I would not recommend therapy in this adjuvant setting. --I discussed the risk of cancer recurrence in the future, which is likely in 10-20% range. I discussed the surveillance plan, which is a physical exam and lab test (including CBC, CMP and CEA) every 3-4 months for the first 2 years, then every 6-12 months, and surveilliance CT scan every 6-12 month for up to 5 year (likely for first 3 years). -Patient and her husband voiced good understanding, and agrees with the plan.  2. Anemia  -Secondary to her recent small bowel cancer, and underlying Crohn disease -I will check CBC and iron studies today.  I also discussed the benefit of IV iron, if needed.  3.  Crohn disease -f/u with Dr. Renee Harder at [facility] -we discussed the slightly increased risk of solid tumor from immunosuppressant, such as Stelara. However if her PET scan is negative, and if Dr. Sima Matas feels Stelara will significantly improve her Crohn's disease symptoms, then it is still  reasonable to proceed.   Plan -surgical path reviewed, I do not recommend adjuvant chemo -lab today  -PET scan in next 2-3 week, I will call her with results  -lab and f/u in 3 months  -copy Drs. Waters and [employer]     Orders Placed This Encounter  Procedures   NM PET Image Initial (PI) Skull Base To Thigh    Standing Status:   Future    Standing Expiration Date:   10/25/2021    Order Specific Question:   If indicated for the ordered procedure, I authorize the administration of a radiopharmaceutical per Radiology protocol    Answer:   Yes    Order Specific Question:   Is the patient pregnant?    Answer:   No    Order Specific Question:   Preferred imaging location?    Answer:   [place]   CBC with Differential (Cancer Center Only)    Standing Status:   Standing    Number of Occurrences:   10    Standing Expiration Date:   10/25/2021   CMP ([facility] only)    Standing Status:   Standing    Number of Occurrences:   10    Standing Expiration Date:   10/25/2021   CEA (IN HOUSE-[facility])FOR [facility] ONLY    Standing Status:   Standing    Number of Occurrences:   10    Standing Expiration Date:   10/25/2021   Iron and TIBC    Standing Status:   Standing    Number of Occurrences:   10    Standing Expiration Date:   10/25/2021   Ferritin    Standing Status:   Standing    Number of Occurrences:   10    Standing Expiration Date:  10/25/2021   Celiac panel 10    Standing Status:   Future    Number of Occurrences:   1    Standing Expiration Date:   10/25/2021    All questions were answered. The patient knows to call the clinic with any problems, questions or concerns. I spent 50 minutes counseling the patient face to face. The total time spent in the appointment was 60 minutes and more than 50% was on counseling.     Truitt Merle, MD 10/25/2020

## 2020-10-25 NOTE — Patient Outreach (Signed)
[facility]) Care Management  10/25/2020  PARRISH DADDARIO 1960-04-19 [id number]   Transition of care call Referral received: 10/11/20 Initial outreach attempt: 10/18/20 Insurance: [place]   #3 Outreach attempt   Third unsuccessful telephone call to patient's preferred contact number in order to complete post hospital discharge transition of care assessment; no answer, left HIPAA compliant message requesting return call.   Objective: Per the electronic medical record, Mrs. Jody Chen  was hospitalized at  [facility]  7/6-7/11/22 for Segmental small bowel resection. Comorbidities include: Crohn's Disease, She was discharged to home on 10/16/20  without the need for home health services or durable medical equipment per the discharge summary. Plan: If no return call from patient, will plan return call in the next 3 weeks.  Joylene Draft, RN, BSN  [place] Management Coordinator  [phone]- Mobile [phone]- Toll Free Main Office

## 2020-10-25 NOTE — Progress Notes (Signed)
I met with Jody Chen and her husband.  I reviewed my role as the nurse navigator and provided my contact information.  All questions were answered.  She is aware Dr Burr Medico is ordering a PET scan and I will call with the appt date and time.  They verbalized understanding.

## 2020-10-26 NOTE — Progress Notes (Signed)
I spoke with Jody Chen and let her know the PET appt date, time, location, and instructions.  She verbalized understanding.  We reviewed her lab results from yesterday.  She has low iron and ferritin.  She was taking oral iron before her surgery.  I encouraged her to resume.  I also instructed her to take with vit c for better absorption.  I made a phone visit appt with Dr Burr Medico on 7/27 at 1600 for PET scan result discussion.  She verbalized understanding.

## 2020-10-30 ENCOUNTER — Ambulatory Visit (HOSPITAL_COMMUNITY)
Admission: RE | Admit: 2020-10-30 | Discharge: 2020-10-30 | Disposition: A | Discharge status: Home / Self Care | Payer: 59 | Source: Ambulatory Visit | Attending: Hematology | Admitting: Hematology

## 2020-10-30 ENCOUNTER — Other Ambulatory Visit: Payer: Self-pay

## 2020-10-30 DIAGNOSIS — C179 Malignant neoplasm of small intestine, unspecified: Secondary | ICD-10-CM | POA: Insufficient documentation

## 2020-10-30 LAB — GLUCOSE, CAPILLARY: Glucose-Capillary: 98 mg/dL (ref 70–99)

## 2020-10-30 LAB — CELIAC PANEL 10
Antigliadin Abs, IgA: 3 units (ref 0–19)
Endomysial Ab, IgA: NEGATIVE
Gliadin IgG: 2 units (ref 0–19)
IgA: 197 mg/dL (ref 87–352)
Tissue Transglut Ab: <2 U/mL (ref 0–5)
Tissue Transglutaminase Ab, IgA: <2 U/mL (ref 0–3)

## 2020-10-30 MED ORDER — FLUDEOXYGLUCOSE F - 18 (FDG) INJECTION
6.5000 | Freq: Once | INTRAVENOUS | Status: AC
  Administered 2020-10-30: 6.31 via INTRAVENOUS

## 2020-11-01 ENCOUNTER — Other Ambulatory Visit (HOSPITAL_COMMUNITY): Payer: Self-pay

## 2020-11-01 ENCOUNTER — Other Ambulatory Visit: Payer: Self-pay

## 2020-11-01 ENCOUNTER — Encounter: Payer: Self-pay | Admitting: Hematology

## 2020-11-01 ENCOUNTER — Inpatient Hospital Stay (HOSPITAL_BASED_OUTPATIENT_CLINIC_OR_DEPARTMENT_OTHER): Payer: 59 | Admitting: Hematology

## 2020-11-01 DIAGNOSIS — C179 Malignant neoplasm of small intestine, unspecified: Secondary | ICD-10-CM

## 2020-11-01 MED FILL — Estradiol Gel 0.06% (0.75 MG/1.25 GM Metered-Dose Pump): TRANSDERMAL | 30 days supply | Qty: 50 | Fill #1 | Status: AC

## 2020-11-01 MED FILL — Alprazolam Tab 0.5 MG: ORAL | 30 days supply | Qty: 20 | Fill #0 | Status: AC

## 2020-11-01 NOTE — Progress Notes (Signed)
[place]   Telephone:(336) [phone] Fax:(336) [phone]   Clinic Follow up Note   Patient Care Team: Pcp, No as PCP - General Alfonzo Feller, RN as [facility] Truitt Merle, MD as Consulting Physician (Oncology) Royston Bake, RN as Oncology Nurse Navigator (Oncology) 11/01/2020  I connected with Jody Chen on 11/01/20 at  4:00 PM EDT by telephone and verified that I am speaking with the correct person using two identifiers.   I discussed the limitations, risks, security and privacy concerns of performing an evaluation and management service by telephone and the availability of in person appointments. I also discussed with the patient that there may be a patient responsible charge related to this service. The patient expressed understanding and agreed to proceed.   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[place])  10/11/2020 Cancer Staging   Staging form: Small Intestine - Adenocarcinoma, AJCC 8th Edition - Pathologic stage from 10/11/2020: Stage IIA (pT3, pN0, cM0) - Signed by Truitt Merle, MD on 10/25/2020  Total positive nodes: 0  Total nodes examined: 12  Histologic grade (G): G3  Histologic grading system: 4 grade system  Residual tumor (R): R0 - None  Tumor location in duodenum: Jejunum  Microsatellite instability (MSI): Unstable high    10/24/2020 Initial Diagnosis   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[place])      CURRENT THERAPY:   INTERVAL HISTORY:   REVIEW OF SYSTEMS:   Constitutional: Denies fevers, chills or abnormal weight loss Eyes: Denies blurriness of vision Ears, nose, mouth, throat, and face: Denies mucositis or sore throat Respiratory: Denies cough, dyspnea or wheezes Cardiovascular: Denies palpitation, chest discomfort or lower extremity swelling Gastrointestinal:  Denies nausea, heartburn or  change in bowel habits Skin: Denies abnormal skin rashes Lymphatics: Denies new lymphadenopathy or easy bruising Neurological:Denies numbness, tingling or new weaknesses Behavioral/Psych: Mood is stable, no new changes  All other systems were reviewed with the patient and are negative.  MEDICAL HISTORY:  Past Medical History:  Diagnosis Date   Abdominal distention    Abdominal pain    Cancer ([place])    thyroid   Constipation    Crohn's    Migraines    Thyroid disease     SURGICAL HISTORY: Past Surgical History:  Procedure Laterality Date   BREAST SURGERY     THYROIDECTOMY, PARTIAL  8/94    I have reviewed the social history and family history with the patient and they are unchanged from previous note.  ALLERGIES:  is allergic to butorphanol and sulfa antibiotics.  MEDICATIONS:  Current Outpatient Medications  Medication Sig Dispense Refill   ALPRAZolam (XANAX) 0.5 MG tablet alprazolam 0.5 mg tablet     ALPRAZolam (XANAX) 0.5 MG tablet TAKE 1 TABLET BY MOUTH ONCE DAILY AS NEEDED FOR ANXIETY. 20 tablet 1   alprazolam (XANAX) 2 MG tablet alprazolam 2 mg tablet  TAKE 1/2 TABLET ORALLY EVERY 8 HOURS AS NEEDED FOR ANXIETY     Azelastine-Fluticasone 137-50 MCG/ACT SUSP azelastine-fluticasone 137 mcg-50 mcg/spray nasal spray     Azelastine-Fluticasone 137-50 MCG/ACT SUSP USE 1 SPRAY IN EACH NOSTRIL TWO TIMES DAILY 23 g 5   Azelastine-Fluticasone 137-50 MCG/ACT SUSP 1 spray in each nostril Nasally Twice a day 30 day(s) 23 g 5   escitalopram (LEXAPRO) 10 MG tablet Take 1 tablet by mouth once a day 90 tablet 3  Estradiol (ESTROGEL) 0.75 MG/1.25 GM (0.06%) topical gel EstroGel 1.25 gram/actuation (0.06%) transdermal gel pump     Estradiol 0.75 MG/1.25 GM (0.06%) topical gel APPLY 1 PUMP EVERY DAY AS DIRECTED 50 g 3   Estradiol 0.75 MG/1.25 GM (0.06%) topical gel APPLY 1 PUMP EVERY DAY 150 g 3   FeFum-FePoly-FA-B Cmp-C-Biot (INTEGRA PLUS) CAPS TAKE 1 CAPSULE BY MOUTH ONCE A DAY 30  capsule 6   levocetirizine (XYZAL) 5 MG tablet Take 1 tablet by mouth once daily 30 tablet 5   levothyroxine (SYNTHROID) 50 MCG tablet TAKE 1 TABLET BY MOUTH EVERY MORNING ON AN EMPTY STOMACH 90 tablet 0   levothyroxine (SYNTHROID) 50 MCG tablet Take 1 tablet by mouth on Sunday mornings on an empty stomach. 12 tablet 4   levothyroxine (SYNTHROID, LEVOTHROID) 175 MCG tablet Take 200 mcg by mouth daily.      mesalamine (LIALDA) 1.2 g EC tablet Take 1.2 g by mouth daily with breakfast.     progesterone (PROMETRIUM) 100 MG capsule      progesterone (PROMETRIUM) 100 MG capsule TAKE 1 CAPSULE BY MOUTH ONCE DAILY AT BEDTIME 90 capsule 3   SUMAtriptan (IMITREX) 100 MG tablet      SUMAtriptan (IMITREX) 50 MG tablet Imitrex 50 mg tablet  Take 1 tablet every 6 hours by oral route as needed for headache.     SYNTHROID 200 MCG tablet      SYNTHROID 200 MCG tablet TAKE 1 TABLET BY MOUTH IN THE MORNING ON AN EMPTY STOMACH 90 tablet 1   SYNTHROID 200 MCG tablet TAKE 1 TABLET BY MOUTH IN THE MORNING ON AN EMPTY STOMACH 30 tablet 11   terbinafine (LAMISIL) 250 MG tablet Take 1 tablet (250 mg total) by mouth daily. 60 tablet 0   testosterone (ANDROGEL) 50 MG/5GM (1%) GEL Place 5 g onto the skin daily.     zolpidem (AMBIEN) 10 MG tablet Ambien 10 mg tablet  Take 1 tablet by oral route at bedtime.     No current facility-administered medications for this visit.    PHYSICAL EXAMINATION: ECOG PERFORMANCE STATUS: 0 - Asymptomatic  There were no vitals filed for this visit. There were no vitals filed for this visit.  GENERAL:alert, no distress and comfortable SKIN: skin color, texture, turgor are normal, no rashes or significant lesions EYES: normal, Conjunctiva are pink and non-injected, sclera clear OROPHARYNX:no exudate, no erythema and lips, buccal mucosa, and tongue normal  NECK: supple, thyroid normal size, non-tender, without nodularity LYMPH:  no palpable lymphadenopathy in the cervical, axillary or  inguinal LUNGS: clear to auscultation and percussion with normal breathing effort HEART: regular rate & rhythm and no murmurs and no lower extremity edema ABDOMEN:abdomen soft, non-tender and normal bowel sounds Musculoskeletal:no cyanosis of digits and no clubbing  NEURO: alert & oriented x 3 with fluent speech, no focal motor/sensory deficits  LABORATORY DATA:  I have reviewed the data as listed CBC Latest Ref Rng & Units 10/25/2020  WBC 4.0 - 10.5 K/uL 7.7  Hemoglobin 12.0 - 15.0 g/dL 9.4(L)  Hematocrit 36.0 - 46.0 % 30.6(L)  Platelets 150 - 400 K/uL 434(H)     CMP Latest Ref Rng & Units 10/25/2020 02/09/2020  Glucose 70 - 99 mg/dL 79 79  BUN 6 - 20 mg/dL 17 14  Creatinine 0.44 - 1.00 mg/dL 0.62 0.69  Sodium 135 - 145 mmol/L 142 141  Potassium 3.5 - 5.1 mmol/L 4.3 4.5  Chloride 98 - 111 mmol/L 106 111(H)  CO2 22 - 32  mmol/L 26 27  Calcium 8.9 - 10.3 mg/dL 9.3 8.5(L)  Total Protein 6.5 - 8.1 g/dL 6.8 4.9(L)  Total Bilirubin 0.3 - 1.2 mg/dL 0.6 0.3  Alkaline Phos 38 - 126 U/L 90 -  AST 15 - 41 U/L 20 38(H)  ALT 0 - 44 U/L 36 32(H)      RADIOGRAPHIC STUDIES: I have personally reviewed the radiological images as listed and agreed with the findings in the report. No results found.   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  No evidence of metastatic disease. -Given her stage II disease and MSI high, I do not recommend adjuvant chemotherapy -Continue cancer surveillance, plan to see her back in 3 months, and repeat a CT abdomen pelvis in 6 months  2.  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  She knows to watch constipation  3.  Crohn's disease -Follow-up with GI Dr. Cecilie Lowers at [facility] and PET scan discussed -lab and f/u in 3 months    No orders of the defined types were placed in this encounter.  I discussed the assessment and treatment plan with the patient. The patient was provided an opportunity to ask questions and all were answered. The patient agreed with the plan and demonstrated an understanding of the instructions.   The patient was advised to call back or seek an in-person evaluation if the symptoms worsen or if the condition fails to improve as anticipated.  I provided 15 minutes of non face-to-face telephone visit time during this encounter, and > 50% was spent counseling as documented under my assessment & plan.    Truitt Merle, MD 11/01/20

## 2020-11-01 NOTE — Progress Notes (Signed)
The proposed treatment discussed in conference is for discussion purpose only and is not a binding recommendation.  The patients have not been physically examined, or presented with their treatment options.  Therefore, final treatment plans cannot be decided.  

## 2020-11-02 ENCOUNTER — Telehealth: Payer: Self-pay | Admitting: Hematology

## 2020-11-02 ENCOUNTER — Other Ambulatory Visit (HOSPITAL_COMMUNITY): Payer: Self-pay

## 2020-11-02 NOTE — Telephone Encounter (Signed)
Scheduled follow-up appointment per 7/27 los. Patient is aware.

## 2020-11-07 DIAGNOSIS — E890 Postprocedural hypothyroidism: Secondary | ICD-10-CM | POA: Diagnosis not present

## 2020-11-15 ENCOUNTER — Other Ambulatory Visit: Payer: Self-pay | Admitting: *Deleted

## 2020-11-15 ENCOUNTER — Encounter: Payer: Self-pay | Admitting: *Deleted

## 2020-11-15 NOTE — Patient Outreach (Signed)
[facility]) Care Management  11/15/2020  Jody Chen 1961-04-02 [id number]   Transition of care call/case closure   Referral received:10/11/20 Initial outreach:10/18/20 Insurance: [facility]    4th call attempt  Subjective: 4th attempt  successful telephone call to patient's preferred number in order to complete transition of care assessment; 2 HIPAA identifiers verified. Explained purpose of call and completed transition of care assessment.  Jody Chen states that she is doing well since surgery,denies post-operative problems, says surgical incisions are unremarkable, states surgical pain well managed with prescribed medications. She report tolerating diet and eating healthy, she discussed plans to follow up nutritionist through [facility] after discussion at GI appointment. Encouraged to contact number on back of [employer] insurance card if question about benefits. She  denies bowel or bladder problems.  She discussed tolerating daily activities during recovery, family available to assist in recovery.   She declined any other questions, resource information needs.   Reviewed accessing the following [place] Benefits : She uses a [facility] outpatient pharmacy at [place].      Objective:  Mrs. Jody Chen  was hospitalized at  [facility]  7/6-7/11/22 for Segmental small bowel resection. Comorbidities include: Crohn's Disease, She was discharged to home on 10/16/20  without the need for home health services or durable medical equipment per the discharge summary. Assessment:  Patient voices good understanding of all discharge instructions.  See transition of care flowsheet for assessment details.   Plan:  Reviewed hospital discharge diagnosis of Segmental small bowel resection, small bowel cancer and discharge treatment plan using hospital discharge instructions, assessing medication adherence, reviewing problems requiring  provider notification, and discussing the importance of follow up with surgeon, primary care provider and/or specialists as directed.  Reviewed [facility] healthy lifestyle program information to receive discounted premium for  2023   Step 1: Get  your annual physical  Step 2: Complete your health assessment  Step 3:Identify your current health status and complete the corresponding action step between April 08, 2020 and December 07, 2020.    Marland Kitchen    No ongoing care management needs identified so will close case to [facility] services   Joylene Draft, RN, BSN  [place] Management Coordinator  [phone]- Mobile [phone]- [place]

## 2020-11-16 DIAGNOSIS — D500 Iron deficiency anemia secondary to blood loss (chronic): Secondary | ICD-10-CM | POA: Diagnosis not present

## 2020-11-16 DIAGNOSIS — C171 Malignant neoplasm of jejunum: Secondary | ICD-10-CM | POA: Diagnosis not present

## 2020-11-16 DIAGNOSIS — K50012 Crohn's disease of small intestine with intestinal obstruction: Secondary | ICD-10-CM | POA: Diagnosis not present

## 2020-11-20 DIAGNOSIS — M8580 Other specified disorders of bone density and structure, unspecified site: Secondary | ICD-10-CM | POA: Diagnosis not present

## 2020-11-20 DIAGNOSIS — L659 Nonscarring hair loss, unspecified: Secondary | ICD-10-CM | POA: Diagnosis not present

## 2020-11-20 DIAGNOSIS — K909 Intestinal malabsorption, unspecified: Secondary | ICD-10-CM | POA: Diagnosis not present

## 2020-11-20 DIAGNOSIS — C73 Malignant neoplasm of thyroid gland: Secondary | ICD-10-CM | POA: Diagnosis not present

## 2020-11-20 DIAGNOSIS — E611 Iron deficiency: Secondary | ICD-10-CM | POA: Diagnosis not present

## 2020-11-20 DIAGNOSIS — K5090 Crohn's disease, unspecified, without complications: Secondary | ICD-10-CM | POA: Diagnosis not present

## 2020-11-20 DIAGNOSIS — D509 Iron deficiency anemia, unspecified: Secondary | ICD-10-CM | POA: Diagnosis not present

## 2020-11-20 DIAGNOSIS — E890 Postprocedural hypothyroidism: Secondary | ICD-10-CM | POA: Diagnosis not present

## 2020-12-19 ENCOUNTER — Other Ambulatory Visit (HOSPITAL_BASED_OUTPATIENT_CLINIC_OR_DEPARTMENT_OTHER): Payer: Self-pay

## 2020-12-19 ENCOUNTER — Other Ambulatory Visit (HOSPITAL_COMMUNITY): Payer: Self-pay

## 2020-12-19 MED FILL — Estradiol Gel 0.06% (0.75 MG/1.25 GM Metered-Dose Pump): TRANSDERMAL | 30 days supply | Qty: 50 | Fill #2 | Status: AC

## 2020-12-20 ENCOUNTER — Other Ambulatory Visit (HOSPITAL_COMMUNITY): Payer: Self-pay

## 2020-12-21 ENCOUNTER — Other Ambulatory Visit (HOSPITAL_COMMUNITY): Payer: Self-pay

## 2020-12-25 ENCOUNTER — Other Ambulatory Visit (HOSPITAL_COMMUNITY): Payer: Self-pay

## 2020-12-25 MED ORDER — LEVOTHYROXINE SODIUM 200 MCG PO TABS
ORAL_TABLET | ORAL | 3 refills | Status: DC
  Filled 2020-12-25: qty 78, 90d supply, fill #0

## 2020-12-25 MED ORDER — SYNTHROID 200 MCG PO TABS
ORAL_TABLET | ORAL | 4 refills | Status: DC
  Filled 2020-12-25: qty 78, 90d supply, fill #0

## 2020-12-25 MED ORDER — LEVOTHYROXINE SODIUM 50 MCG PO TABS
ORAL_TABLET | ORAL | 4 refills | Status: DC
  Filled 2020-12-25: qty 12, 84d supply, fill #0

## 2020-12-25 MED ORDER — SYNTHROID 200 MCG PO TABS
ORAL_TABLET | ORAL | 4 refills | Status: DC
  Filled 2020-12-25: qty 78, 90d supply, fill #0
  Filled 2021-03-27: qty 78, 90d supply, fill #1
  Filled 2021-07-01: qty 78, 90d supply, fill #2
  Filled 2021-10-12: qty 78, 90d supply, fill #3

## 2020-12-26 DIAGNOSIS — C171 Malignant neoplasm of jejunum: Secondary | ICD-10-CM | POA: Diagnosis not present

## 2021-01-12 ENCOUNTER — Other Ambulatory Visit (HOSPITAL_COMMUNITY): Payer: Self-pay

## 2021-01-15 ENCOUNTER — Other Ambulatory Visit (HOSPITAL_COMMUNITY): Payer: Self-pay

## 2021-01-22 ENCOUNTER — Inpatient Hospital Stay: Payer: 59 | Attending: Hematology | Admitting: Hematology

## 2021-01-22 ENCOUNTER — Other Ambulatory Visit: Payer: Self-pay

## 2021-01-22 ENCOUNTER — Encounter: Payer: Self-pay | Admitting: Hematology

## 2021-01-22 ENCOUNTER — Inpatient Hospital Stay: Payer: 59

## 2021-01-22 VITALS — BP 114/75 | HR 72 | Temp 98.0°F | Resp 17 | Ht 69.5 in | Wt 140.1 lb

## 2021-01-22 DIAGNOSIS — C179 Malignant neoplasm of small intestine, unspecified: Secondary | ICD-10-CM

## 2021-01-22 DIAGNOSIS — D500 Iron deficiency anemia secondary to blood loss (chronic): Secondary | ICD-10-CM

## 2021-01-22 DIAGNOSIS — C171 Malignant neoplasm of jejunum: Secondary | ICD-10-CM | POA: Insufficient documentation

## 2021-01-22 DIAGNOSIS — K5090 Crohn's disease, unspecified, without complications: Secondary | ICD-10-CM | POA: Insufficient documentation

## 2021-01-22 DIAGNOSIS — Z79899 Other long term (current) drug therapy: Secondary | ICD-10-CM | POA: Diagnosis not present

## 2021-01-22 DIAGNOSIS — D509 Iron deficiency anemia, unspecified: Secondary | ICD-10-CM | POA: Insufficient documentation

## 2021-01-22 DIAGNOSIS — K56600 Partial intestinal obstruction, unspecified as to cause: Secondary | ICD-10-CM | POA: Insufficient documentation

## 2021-01-22 DIAGNOSIS — E079 Disorder of thyroid, unspecified: Secondary | ICD-10-CM | POA: Diagnosis not present

## 2021-01-22 LAB — CBC WITH DIFFERENTIAL (CANCER CENTER ONLY)
Abs Immature Granulocytes: 0.01 10*3/uL (ref 0.00–0.07)
Basophils Absolute: 0 10*3/uL (ref 0.0–0.1)
Basophils Relative: 0 %
Eosinophils Absolute: 0.4 10*3/uL (ref 0.0–0.5)
Eosinophils Relative: 7 %
HCT: 37.7 % (ref 36.0–46.0)
Hemoglobin: 12.7 g/dL (ref 12.0–15.0)
Immature Granulocytes: 0 %
Lymphocytes Relative: 27 %
Lymphs Abs: 1.4 10*3/uL (ref 0.7–4.0)
MCH: 29.7 pg (ref 26.0–34.0)
MCHC: 33.7 g/dL (ref 30.0–36.0)
MCV: 88.3 fL (ref 80.0–100.0)
Monocytes Absolute: 0.5 10*3/uL (ref 0.1–1.0)
Monocytes Relative: 10 %
Neutro Abs: 2.9 10*3/uL (ref 1.7–7.7)
Neutrophils Relative %: 56 %
Platelet Count: 257 10*3/uL (ref 150–400)
RBC: 4.27 MIL/uL (ref 3.87–5.11)
RDW: 17.9 % — ABNORMAL HIGH (ref 11.5–15.5)
WBC Count: 5.2 10*3/uL (ref 4.0–10.5)
nRBC: 0 % (ref 0.0–0.2)

## 2021-01-22 LAB — CMP (CANCER CENTER ONLY)
ALT: 24 U/L (ref 0–44)
AST: 28 U/L (ref 15–41)
Albumin: 3.5 g/dL (ref 3.5–5.0)
Alkaline Phosphatase: 101 U/L (ref 38–126)
Anion gap: 8 (ref 5–15)
BUN: 14 mg/dL (ref 6–20)
CO2: 26 mmol/L (ref 22–32)
Calcium: 9.1 mg/dL (ref 8.9–10.3)
Chloride: 110 mmol/L (ref 98–111)
Creatinine: 0.71 mg/dL (ref 0.44–1.00)
GFR, Estimated: >60 mL/min (ref >60)
Glucose, Bld: 93 mg/dL (ref 70–99)
Potassium: 4.2 mmol/L (ref 3.5–5.1)
Sodium: 144 mmol/L (ref 135–145)
Total Bilirubin: 0.5 mg/dL (ref 0.3–1.2)
Total Protein: 6.8 g/dL (ref 6.5–8.1)

## 2021-01-22 LAB — IRON AND TIBC
Iron: 60 ug/dL (ref 41–142)
Saturation Ratios: 17 % — ABNORMAL LOW (ref 21–57)
TIBC: 351 ug/dL (ref 236–444)
UIBC: 292 ug/dL (ref 120–384)

## 2021-01-22 LAB — CEA (IN HOUSE-CHCC): CEA (CHCC-In House): 2.3 ng/mL (ref 0.00–5.00)

## 2021-01-22 LAB — FERRITIN: Ferritin: 17 ng/mL (ref 11–307)

## 2021-01-22 NOTE — Progress Notes (Signed)
[place]   Telephone:(336) [phone] Fax:(336) [phone]   Clinic Follow up Note   Patient Care Team: Pcp, No as PCP - General Truitt Merle, MD as Consulting Physician (Oncology) Royston Bake, RN as Oncology Nurse Navigator (Oncology)  Date of Service:  01/22/2021  CHIEF COMPLAINT: f/u of small bowel cancer  CURRENT THERAPY:  Surveillance  ASSESSMENT & PLAN:  Jody Chen is a 60 y.o. female with   1. 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 She underwent jejunal resection on 10/11/20 for recurrent Crohn's flare, pathology revealed poorly differentiated carcinoma with medullary features, 10 cm, invading muscularis propria, margins and lymph nodes negative. -baseline CEA 10/25/20 WNL -staging PET scan 10/30/20 showed no residual or metastatic disease. -Given her stage II disease and MSI high, I do not recommend adjuvant chemotherapy -Continue cancer surveillance, plan to see her back in 4 months, and repeat a CT abdomen pelvis in 10/2021   2.  Iron deficient anemia -Secondary to small bowel cancer and Crohn's disease -resolved since surgery, hgb WNL, iron panel pending.   3.  Crohn's disease -Follow-up with GI Dr. Cecilie Lowers at [facility] -lab and f/u in 4 months, will order surveillance CT on next visit    No problem-specific Assessment & Plan notes found for this encounter.   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 She was last seen by me on 11/01/20. She presents to the clinic alone. She reports she is doing well and has recovered well from surgery.   All other systems were reviewed with the patient and are negative.  MEDICAL HISTORY:  Past Medical History:  Diagnosis Date   Abdominal distention    Abdominal pain    Cancer ([place])    thyroid   Constipation    Crohn's    Migraines    Thyroid disease     SURGICAL HISTORY: Past Surgical History:  Procedure Laterality Date   BREAST SURGERY     COLON SURGERY     segmental jejunal small bowe resection      THYROIDECTOMY, PARTIAL  11/06/1992    I have reviewed the social history and family history with the patient and they are unchanged from previous note.  ALLERGIES:  is allergic to butorphanol and sulfa antibiotics.  MEDICATIONS:  Current Outpatient Medications  Medication Sig Dispense Refill   ALPRAZolam (XANAX) 0.5 MG tablet alprazolam 0.5 mg tablet     alprazolam (XANAX) 2 MG tablet alprazolam 2 mg tablet  TAKE 1/2 TABLET ORALLY EVERY 8 HOURS AS NEEDED FOR ANXIETY     escitalopram (LEXAPRO) 10 MG tablet Take 1 tablet by mouth once a day 90 tablet 3   FeFum-FePoly-FA-B Cmp-C-Biot (INTEGRA PLUS) CAPS TAKE 1 CAPSULE BY MOUTH ONCE A DAY 30 capsule 6   levocetirizine (XYZAL) 5 MG tablet  Take 1 tablet by mouth once daily 30 tablet 5   levothyroxine (SYNTHROID, LEVOTHROID) 175 MCG tablet Take 200 mcg by mouth daily.      SUMAtriptan (IMITREX) 100 MG tablet      SUMAtriptan (IMITREX) 50 MG tablet Imitrex 50 mg tablet  Take 1 tablet every 6 hours by oral route as needed for headache.     SYNTHROID 200 MCG tablet Take 1 tablet in the morning on an empty stomach Monday - Saturday 78 tablet 4   terbinafine (LAMISIL) 250 MG tablet Take 1 tablet (250 mg total) by mouth  daily. 60 tablet 0   testosterone (ANDROGEL) 50 MG/5GM (1%) GEL Place 5 g onto the skin daily.     Azelastine-Fluticasone 137-50 MCG/ACT SUSP USE 1 SPRAY IN EACH NOSTRIL TWO TIMES DAILY 23 g 5   Estradiol 0.75 MG/1.25 GM (0.06%) topical gel APPLY 1 PUMP EVERY DAY AS DIRECTED 50 g 3   progesterone (PROMETRIUM) 100 MG capsule TAKE 1 CAPSULE BY MOUTH ONCE DAILY AT BEDTIME 90 capsule 3   No current facility-administered medications for this visit.    PHYSICAL EXAMINATION: ECOG PERFORMANCE STATUS: 0 - Asymptomatic  Vitals:   01/22/21 1422  BP: 114/75  Pulse: 72  Resp: 17  Temp: 98 F (36.7 C)  SpO2: 100%   Wt Readings from Last 3 Encounters:  01/22/21 140 lb 1.6 oz (63.5 kg)  10/25/20 124 lb 4.8 oz (56.4 kg)  04/20/15 134 lb 6.4 oz (61 kg)     GENERAL:alert, no distress and comfortable SKIN: skin color, texture, turgor are normal, no rashes or significant lesions EYES: normal, Conjunctiva are pink and non-injected, sclera clear  NECK: supple, thyroid normal size, non-tender, without nodularity LYMPH:  no palpable lymphadenopathy in the cervical, axillary  LUNGS: clear to auscultation and percussion with normal breathing effort HEART: regular rate & rhythm and no murmurs and no lower extremity edema ABDOMEN:abdomen soft, non-tender and normal bowel sounds Musculoskeletal:no cyanosis of digits and no clubbing  NEURO: alert & oriented x 3 with fluent speech, no focal motor/sensory deficits  LABORATORY DATA:  I have reviewed the data as listed CBC Latest Ref Rng & Units 01/22/2021 10/25/2020  WBC 4.0 - 10.5 K/uL 5.2 7.7  Hemoglobin 12.0 - 15.0 g/dL 12.7 9.4(L)  Hematocrit 36.0 - 46.0 % 37.7 30.6(L)  Platelets 150 - 400 K/uL 257 434(H)     CMP Latest Ref Rng & Units 01/22/2021 10/25/2020 02/09/2020  Glucose 70 - 99 mg/dL 93 79 79  BUN 6 - 20 mg/dL 14 17 14   Creatinine 0.44 - 1.00 mg/dL 0.71 0.62 0.69  Sodium 135 - 145 mmol/L 144 142 141  Potassium 3.5 - 5.1 mmol/L 4.2 4.3 4.5   Chloride 98 - 111 mmol/L 110 106 111(H)  CO2 22 - 32 mmol/L 26 26 27   Calcium 8.9 - 10.3 mg/dL 9.1 9.3 8.5(L)  Total Protein 6.5 - 8.1 g/dL 6.8 6.8 4.9(L)  Total Bilirubin 0.3 - 1.2 mg/dL 0.5 0.6 0.3  Alkaline Phos 38 - 126 U/L 101 90 -  AST 15 - 41 U/L 28 20 38(H)  ALT 0 - 44 U/L 24 36 32(H)      RADIOGRAPHIC STUDIES: I have personally reviewed the radiological images as listed and agreed with the findings in the report. No results found.    No orders of the defined types were placed in this encounter.  All questions were answered. The patient knows to call the clinic with any problems, questions or concerns. No  barriers to learning was detected. The total time spent in the appointment was 20 minutes.     Truitt Merle, MD 01/22/2021   I, Wilburn Mylar, am acting as scribe for Truitt Merle, MD.   I have reviewed the above documentation for accuracy and completeness, and I agree with the above.

## 2021-01-23 ENCOUNTER — Encounter: Payer: Self-pay | Admitting: Hematology

## 2021-02-01 ENCOUNTER — Other Ambulatory Visit (HOSPITAL_COMMUNITY): Payer: Self-pay

## 2021-02-01 MED ORDER — VALACYCLOVIR HCL 500 MG PO TABS
500.0000 mg | ORAL_TABLET | Freq: Two times a day (BID) | ORAL | 0 refills | Status: AC
  Filled 2021-02-01: qty 10, 5d supply, fill #0

## 2021-02-14 ENCOUNTER — Other Ambulatory Visit (HOSPITAL_COMMUNITY): Payer: Self-pay

## 2021-02-14 MED ORDER — PROGESTERONE MICRONIZED 100 MG PO CAPS
100.0000 mg | ORAL_CAPSULE | Freq: Every day | ORAL | 0 refills | Status: DC
  Filled 2021-02-14: qty 90, 90d supply, fill #0

## 2021-02-15 ENCOUNTER — Other Ambulatory Visit (HOSPITAL_COMMUNITY): Payer: Self-pay

## 2021-02-16 ENCOUNTER — Other Ambulatory Visit (HOSPITAL_COMMUNITY): Payer: Self-pay

## 2021-02-16 MED ORDER — ALPRAZOLAM 0.5 MG PO TABS
0.5000 mg | ORAL_TABLET | Freq: Every day | ORAL | 0 refills | Status: DC | PRN
  Filled 2021-02-16: qty 60, 60d supply, fill #0

## 2021-02-20 DIAGNOSIS — E611 Iron deficiency: Secondary | ICD-10-CM | POA: Diagnosis not present

## 2021-02-20 DIAGNOSIS — K909 Intestinal malabsorption, unspecified: Secondary | ICD-10-CM | POA: Diagnosis not present

## 2021-02-20 DIAGNOSIS — M8580 Other specified disorders of bone density and structure, unspecified site: Secondary | ICD-10-CM | POA: Diagnosis not present

## 2021-02-20 DIAGNOSIS — E890 Postprocedural hypothyroidism: Secondary | ICD-10-CM | POA: Diagnosis not present

## 2021-03-13 ENCOUNTER — Other Ambulatory Visit (HOSPITAL_COMMUNITY): Payer: Self-pay

## 2021-03-13 DIAGNOSIS — Z01419 Encounter for gynecological examination (general) (routine) without abnormal findings: Secondary | ICD-10-CM | POA: Diagnosis not present

## 2021-03-13 DIAGNOSIS — Z760 Encounter for issue of repeat prescription: Secondary | ICD-10-CM | POA: Diagnosis not present

## 2021-03-13 DIAGNOSIS — Z6820 Body mass index (BMI) 20.0-20.9, adult: Secondary | ICD-10-CM | POA: Diagnosis not present

## 2021-03-13 DIAGNOSIS — N951 Menopausal and female climacteric states: Secondary | ICD-10-CM | POA: Diagnosis not present

## 2021-03-13 DIAGNOSIS — Z1231 Encounter for screening mammogram for malignant neoplasm of breast: Secondary | ICD-10-CM | POA: Diagnosis not present

## 2021-03-13 MED ORDER — ESTROGEL 0.75 MG/1.25 GM (0.06%) TD GEL
TRANSDERMAL | 3 refills | Status: DC
  Filled 2021-03-13: qty 150, 90d supply, fill #0

## 2021-03-13 MED ORDER — PROGESTERONE MICRONIZED 100 MG PO CAPS
100.0000 mg | ORAL_CAPSULE | Freq: Every day | ORAL | 3 refills | Status: DC
  Filled 2021-03-13 – 2021-06-06 (×2): qty 90, 90d supply, fill #0
  Filled 2021-09-10: qty 90, 90d supply, fill #1
  Filled 2021-12-07: qty 90, 90d supply, fill #2
  Filled 2022-03-07: qty 90, 90d supply, fill #3

## 2021-03-14 ENCOUNTER — Other Ambulatory Visit (HOSPITAL_COMMUNITY): Payer: Self-pay

## 2021-03-14 MED ORDER — AZELASTINE-FLUTICASONE 137-50 MCG/ACT NA SUSP
NASAL | 5 refills | Status: DC
  Filled 2021-03-14 – 2021-12-30 (×2): qty 23, 30d supply, fill #0

## 2021-03-16 ENCOUNTER — Other Ambulatory Visit (HOSPITAL_COMMUNITY): Payer: Self-pay

## 2021-03-19 ENCOUNTER — Other Ambulatory Visit (HOSPITAL_COMMUNITY): Payer: Self-pay

## 2021-03-23 ENCOUNTER — Other Ambulatory Visit (HOSPITAL_COMMUNITY): Payer: Self-pay

## 2021-03-23 MED ORDER — AZELASTINE-FLUTICASONE 137-50 MCG/ACT NA SUSP
NASAL | 5 refills | Status: DC
  Filled 2021-03-27: qty 23, 30d supply, fill #0

## 2021-03-27 ENCOUNTER — Other Ambulatory Visit (HOSPITAL_COMMUNITY): Payer: Self-pay

## 2021-03-27 MED ORDER — SYNTHROID 50 MCG PO TABS
ORAL_TABLET | ORAL | 5 refills | Status: DC
  Filled 2021-03-27: qty 12, 84d supply, fill #0
  Filled 2021-07-01: qty 12, 84d supply, fill #1
  Filled 2021-10-12: qty 12, 84d supply, fill #2
  Filled 2021-12-30: qty 12, 84d supply, fill #3
  Filled 2022-03-25: qty 12, 84d supply, fill #4

## 2021-03-27 MED ORDER — LEVOTHYROXINE SODIUM 50 MCG PO TABS
ORAL_TABLET | ORAL | 4 refills | Status: DC
  Filled 2021-03-27: qty 12, 84d supply, fill #0

## 2021-03-27 MED ORDER — LEVOTHYROXINE SODIUM 50 MCG PO TABS
ORAL_TABLET | ORAL | 5 refills | Status: DC
  Filled 2021-03-27: qty 12, 84d supply, fill #0

## 2021-03-28 ENCOUNTER — Other Ambulatory Visit (HOSPITAL_COMMUNITY): Payer: Self-pay

## 2021-03-28 MED ORDER — FLUTICASONE PROPIONATE 50 MCG/ACT NA SUSP
NASAL | 5 refills | Status: DC
  Filled 2021-03-28: qty 16, 30d supply, fill #0

## 2021-03-28 MED ORDER — AZELASTINE HCL 137 MCG/SPRAY NA SOLN
NASAL | 5 refills | Status: DC
  Filled 2021-03-28: qty 30, 50d supply, fill #0

## 2021-03-29 ENCOUNTER — Other Ambulatory Visit (HOSPITAL_COMMUNITY): Payer: Self-pay

## 2021-03-30 ENCOUNTER — Other Ambulatory Visit (HOSPITAL_COMMUNITY): Payer: Self-pay

## 2021-04-03 ENCOUNTER — Other Ambulatory Visit (HOSPITAL_COMMUNITY): Payer: Self-pay

## 2021-04-04 ENCOUNTER — Other Ambulatory Visit (HOSPITAL_COMMUNITY): Payer: Self-pay

## 2021-04-05 DIAGNOSIS — K50012 Crohn's disease of small intestine with intestinal obstruction: Secondary | ICD-10-CM | POA: Diagnosis not present

## 2021-04-05 DIAGNOSIS — K602 Anal fissure, unspecified: Secondary | ICD-10-CM | POA: Diagnosis not present

## 2021-04-05 DIAGNOSIS — K645 Perianal venous thrombosis: Secondary | ICD-10-CM | POA: Diagnosis not present

## 2021-04-06 ENCOUNTER — Other Ambulatory Visit (HOSPITAL_COMMUNITY): Payer: Self-pay

## 2021-04-12 ENCOUNTER — Encounter (HOSPITAL_BASED_OUTPATIENT_CLINIC_OR_DEPARTMENT_OTHER): Payer: Self-pay | Admitting: Emergency Medicine

## 2021-04-12 ENCOUNTER — Emergency Department (HOSPITAL_BASED_OUTPATIENT_CLINIC_OR_DEPARTMENT_OTHER): Payer: 59 | Admitting: Radiology

## 2021-04-12 ENCOUNTER — Other Ambulatory Visit: Payer: Self-pay

## 2021-04-12 ENCOUNTER — Emergency Department (HOSPITAL_BASED_OUTPATIENT_CLINIC_OR_DEPARTMENT_OTHER)
Admission: EM | Admit: 2021-04-12 | Discharge: 2021-04-12 | Disposition: A | Discharge status: Home / Self Care | Payer: 59 | Attending: Emergency Medicine | Admitting: Emergency Medicine

## 2021-04-12 DIAGNOSIS — W540XXA Bitten by dog, initial encounter: Secondary | ICD-10-CM | POA: Insufficient documentation

## 2021-04-12 DIAGNOSIS — S61411A Laceration without foreign body of right hand, initial encounter: Secondary | ICD-10-CM | POA: Diagnosis not present

## 2021-04-12 DIAGNOSIS — Z79899 Other long term (current) drug therapy: Secondary | ICD-10-CM | POA: Diagnosis not present

## 2021-04-12 DIAGNOSIS — S61451A Open bite of right hand, initial encounter: Secondary | ICD-10-CM | POA: Diagnosis not present

## 2021-04-12 MED ORDER — ACETAMINOPHEN 325 MG PO TABS
ORAL_TABLET | ORAL | Status: AC
  Filled 2021-04-12: qty 1

## 2021-04-12 MED ORDER — LIDOCAINE-EPINEPHRINE 2 %-1:100000 IJ SOLN: 20.0000 mL | Freq: Once | INTRAMUSCULAR | Status: DC

## 2021-04-12 MED ORDER — AMOXICILLIN-POT CLAVULANATE 875-125 MG PO TABS
1.0000 | ORAL_TABLET | Freq: Two times a day (BID) | ORAL | 0 refills | Status: DC
  Filled 2021-04-12: qty 14, 7d supply, fill #0

## 2021-04-12 MED ORDER — LIDOCAINE-EPINEPHRINE (PF) 2 %-1:200000 IJ SOLN
INTRAMUSCULAR | Status: AC
  Administered 2021-04-12: 2 mL
  Filled 2021-04-12: qty 20

## 2021-04-12 MED ORDER — ACETAMINOPHEN 325 MG PO TABS
650.0000 mg | ORAL_TABLET | Freq: Once | ORAL | Status: AC
  Administered 2021-04-12: 650 mg via ORAL
  Filled 2021-04-12: qty 2

## 2021-04-12 MED ORDER — AMOXICILLIN-POT CLAVULANATE 875-125 MG PO TABS
1.0000 | ORAL_TABLET | Freq: Once | ORAL | Status: AC
  Administered 2021-04-12: 1 via ORAL
  Filled 2021-04-12: qty 1

## 2021-04-12 MED ORDER — HYDROCODONE-ACETAMINOPHEN 5-325 MG PO TABS
1.0000 | ORAL_TABLET | Freq: Once | ORAL | Status: AC
  Administered 2021-04-12: 1 via ORAL
  Filled 2021-04-12: qty 1

## 2021-04-12 NOTE — ED Provider Notes (Signed)
I provided a substantive portion of the care of this patient.  I personally performed the entirety of the history, exam, and medical decision making for this encounter.      Patient sustained a dog bite laceration to her right thenar eminence.  Patient is right-handed.  This occurred this afternoon.  It was from a physician's dog while at the bank.  The dog is up-to-date.  Police are involved.  Dog will be quarantined.  Dog is up-to-date on his shots.  Does not appear that it was a bite because there is no puncture wounds on the back of the hand.  Appears it was more of a tooth laceration.  Patient with sensation intact good cap refill.  Good movement of the right thumb both to flexion and extension and opposition.  As well as good movement to the index finger on that hand.  X-rays showed no evidence of any bony abnormalities.  Wound will be closed loosely.  Dressed with antibiotic ointment.  Patient will be treated with Augmentin.  Wound care instructions provided.  Physician assistant will explore the wound as best as possible there is any concerns for any partial tendon injuries no evidence of any complete tendon injury on exam and patient will be referred to hand surgery.   Fredia Sorrow, MD 04/12/21 2140

## 2021-04-12 NOTE — Discharge Instructions (Signed)
Please monitor your wound closely for any signs of infection.  Take antibiotic as prescribed.  Your sutures may be removed in about 5 days.  Return if you notice signs of infection or if you have any concern.

## 2021-04-12 NOTE — ED Provider Notes (Signed)
[facility] EMERGENCY DEPT Provider Note   CSN: [id number] Arrival date & time: 04/12/21  1536     History  Chief Complaint  Patient presents with   Animal Bite    Jody Chen is a 61 y.o. female.   Animal Bite  61 year old female presenting to ED for evaluation of a dog bite.  Home Medications Prior to Admission medications   Medication Sig Start Date End Date Taking? Authorizing Provider  ALPRAZolam (XANAX) 0.5 MG tablet alprazolam 0.5 mg tablet    [provider]  ALPRAZolam (XANAX) 0.5 MG tablet TAKE 1 TABLET BY MOUTH ONCE DAILY AS NEEDED FOR ANXIETY. 02/16/21     alprazolam (XANAX) 2 MG tablet alprazolam 2 mg tablet  TAKE 1/2 TABLET ORALLY EVERY 8 HOURS AS NEEDED FOR ANXIETY    [provider]  Azelastine HCl 137 MCG/SPRAY SOLN Use 1 spray in each nostril twice a day 03/28/21     Azelastine-Fluticasone (DYMISTA) 137-50 MCG/ACT SUSP Use 1 spray in each nostril twice a day 03/14/21     Azelastine-Fluticasone (DYMISTA) 137-50 MCG/ACT SUSP Place 1 spray in each nostril twice a day for 30 days 03/23/21     Azelastine-Fluticasone 137-50 MCG/ACT SUSP USE 1 SPRAY IN EACH NOSTRIL TWO TIMES DAILY 08/11/19 08/10/20  Tiajuana Amass, MD  escitalopram (LEXAPRO) 10 MG tablet Take 1 tablet by mouth once a day 09/29/20     Estradiol (ESTROGEL) 0.75 MG/1.25 GM (0.06%) topical gel APPLY 1 PUMP TOPICALLY ONCE DAILY AS DIRECTED 03/13/21     Estradiol 0.75 MG/1.25 GM (0.06%) topical gel APPLY 1 PUMP EVERY DAY AS DIRECTED 01/11/20 01/19/21  Dian Queen, MD  FeFum-FePoly-FA-B Cmp-C-Biot (INTEGRA PLUS) CAPS TAKE 1 CAPSULE BY MOUTH ONCE A DAY 04/17/20 04/17/21  Jacelyn Pi, MD  fluticasone (FLONASE) 50 MCG/ACT nasal spray Use 1 spray in each nostril 2 times a day 03/28/21     levocetirizine (XYZAL) 5 MG tablet Take 1 tablet by mouth once daily 08/10/20     levothyroxine (SYNTHROID) 50 MCG tablet Take 1 tablet by mouth every Sunday morning on an empty stomach 03/27/21      levothyroxine (SYNTHROID, LEVOTHROID) 175 MCG tablet Take 200 mcg by mouth daily.     [provider]  progesterone (PROMETRIUM) 100 MG capsule TAKE 1 CAPSULE BY MOUTH ONCE DAILY AT BEDTIME 01/11/20 01/10/21  Dian Queen, MD  progesterone (PROMETRIUM) 100 MG capsule TAKE 1 CAPSULE BY MOUTH ONCE DAILY AT BEDTIME 03/13/21     SUMAtriptan (IMITREX) 100 MG tablet  02/22/19   [provider]  SUMAtriptan (IMITREX) 50 MG tablet Imitrex 50 mg tablet  Take 1 tablet every 6 hours by oral route as needed for headache.    [provider]  SYNTHROID 200 MCG tablet Take 1 tablet in the morning on an empty stomach Monday - Saturday 12/25/20     SYNTHROID 50 MCG tablet Take 1 tablet by mouth every Sunday morning on an empty stomach 03/27/21     terbinafine (LAMISIL) 250 MG tablet Take 1 tablet (250 mg total) by mouth daily. 07/21/19   Wallene Huh, DPM  testosterone (ANDROGEL) 50 MG/5GM (1%) GEL Place 5 g onto the skin daily.    [provider]  valACYclovir (VALTREX) 500 MG tablet Take 1 tablet by mouth 2 times daily for 5 days;  starting 1 day prior to procedure. 02/01/21         Allergies    Butorphanol and Sulfa antibiotics    Review of Systems  Review of Systems  Physical Exam Updated Vital Signs BP 116/84 (BP Location: Right Arm)    Pulse 79    Temp 98.5 F (36.9 C)    Resp 18    Ht 5' 9"  (1.753 m)    Wt 61.2 kg    SpO2 100%    BMI 19.94 kg/m  Physical Exam  ED Results / Procedures / Treatments   Labs (all labs ordered are listed, but only abnormal results are displayed) Labs Reviewed - No data to display  EKG None  Radiology DG Hand Complete Right  Result Date: 04/12/2021 CLINICAL DATA:  Injury.  Dog bite with laceration. EXAM: RIGHT HAND - COMPLETE 3+ VIEW COMPARISON:  None. FINDINGS: There is no evidence of fracture or dislocation. Normal alignment and joint spaces. There is no evidence of arthropathy or other focal bone abnormality. No  radiopaque foreign body or visualized soft tissue air. IMPRESSION: Negative radiographs of the right hand. Electronically Signed   By: Keith Rake M.D.   On: 04/12/2021 17:07    Procedures .Marland KitchenLaceration Repair  Date/Time: 04/12/2021 10:00 PM Performed by: Domenic Moras, PA-C Authorized by: Domenic Moras, PA-C   Consent:    Consent obtained:  Verbal   Consent given by:  Patient   Risks discussed:  Infection, need for additional repair, pain, poor cosmetic result and poor wound healing   Alternatives discussed:  No treatment and delayed treatment Universal protocol:    Procedure explained and questions answered to patient or proxy's satisfaction: yes     Relevant documents present and verified: yes     Test results available: yes     Imaging studies available: yes     Required blood products, implants, devices, and special equipment available: yes     Site/side marked: yes     Immediately prior to procedure, a time out was called: yes     Patient identity confirmed:  Verbally with patient Anesthesia:    Anesthesia method:  Local infiltration   Local anesthetic:  Lidocaine 1% WITH epi Laceration details:    Location:  Hand   Hand location:  R palm   Length (cm):  3   Depth (mm):  3 Pre-procedure details:    Preparation:  Patient was prepped and draped in usual sterile fashion and imaging obtained to evaluate for foreign bodies Exploration:    Limited defect created (wound extended): no     Hemostasis achieved with:  Epinephrine and direct pressure   Imaging obtained: x-ray     Wound exploration: wound explored through full range of motion and entire depth of wound visualized     Contaminated: no   Treatment:    Area cleansed with:  Povidone-iodine and saline   Amount of cleaning:  Extensive   Irrigation solution:  Sterile saline   Irrigation method:  Pressure wash   Visualized foreign bodies/material removed: no   Skin repair:    Repair method:  Sutures   Suture size:  5-0    Suture material:  Prolene   Suture technique:  Simple interrupted   Number of sutures:  2 Approximation:    Approximation:  Loose Repair type:    Repair type:  Simple Post-procedure details:    Dressing:  Non-adherent dressing   Procedure completion:  Tolerated    Medications Ordered in ED Medications  acetaminophen (TYLENOL) 325 MG tablet (has no administration in time range)  amoxicillin-clavulanate (AUGMENTIN) 875-125 MG per tablet 1 tablet (has no administration in time range)  lidocaine-EPINEPHrine (XYLOCAINE  W/EPI) 2 %-1:100000 (with pres) injection 20 mL (has no administration in time range)  acetaminophen (TYLENOL) tablet 650 mg (650 mg Oral Given 04/12/21 1649)    ED Course/ Medical Decision Making/ A&P                           Medical Decision Making  BP 116/84 (BP Location: Right Arm)    Pulse 79    Temp 98.5 F (36.9 C)    Resp 18    Ht 5' 9"  (1.753 m)    Wt 61.2 kg    SpO2 100%    BMI 19.94 kg/m   8:57 PM This is a 61 year old female with pertinent past medical history including anxiety, Crohn's disease, presenting for evaluation of a dog bite.  Patient report earlier today, she was at the bank, she went to pet a dog with her right dominant hand when it bit her once to the palm of her right hand.  She suffered a laceration and bleeding was controlled.  The dog is up-to-date with rabies and immunization.  Patient is up-to-date with tetanus.  She does complain of sharp throbbing pain that initially improved with Tylenol but now the pain has returned.  Pain is nonradiating there is no associate numbness.  No specific treatment tried.  On exam, there is a 3 cm oblique shallow laceration noted to the thenar eminence of the right hand.  It does not involve any joint.  Patient able to fully range her right thumb.  No foreign body noted.  This is a shallow wound and it is not actively bleeding.  Radial pulse 2+, wrist with full range of motion.  I discussed care with patient  and recommend thorough irrigation and cleansing the wound.  Typically I would like to avoid closure of the wound as it does have a strong increased risk of infection.  Patient's husband who is a physician especially strong desire for the wound to be closed.  He felt that he would likely to monitor the wound closely for any signs of infection.  After discussion and consider the length of the wound, I agrees to perform some loose sutures after the wound is thoroughly irrigated but once again reiterate the increased risk of infection.  Patient and husband acknowledged the risk.  I will also give a dose of Augmentin here.  I did discuss care with Dr. Rogene Houston.  Patient is up-to-date with immunization and the dog is also up-to-date with immunization including rabies.  This is likely a provoked bite.  X-ray obtained today which was independently reviewed and interpreted by me showing no concerning finding.  10:01 PM Laceration was thoroughly irrigated by me.  Suture was loosely placed to help approximate the skin.  No signs of muscle or tendon injury or nerve injury on exam.  Encouraged suture removal in 5 days.  Antibiotic prescribed and patient made aware of the risks of infection and to return if worsening.        Final Clinical Impression(s) / ED Diagnoses Final diagnoses:  Dog bite of right hand, initial encounter    Rx / DC Orders ED Discharge Orders          Ordered    amoxicillin-clavulanate (AUGMENTIN) 875-125 MG tablet  Every 12 hours        04/12/21 2203              Domenic Moras, PA-C 04/12/21 2204    Fredia Sorrow,  MD 04/19/21 2341

## 2021-04-12 NOTE — ED Triage Notes (Signed)
Dog bite today rt palm  about 2 inch , bleeding is controled , area is swollen and tender  UTD tetanus shot

## 2021-04-13 ENCOUNTER — Other Ambulatory Visit (HOSPITAL_COMMUNITY): Payer: Self-pay

## 2021-05-01 DIAGNOSIS — L821 Other seborrheic keratosis: Secondary | ICD-10-CM | POA: Diagnosis not present

## 2021-05-01 DIAGNOSIS — L570 Actinic keratosis: Secondary | ICD-10-CM | POA: Diagnosis not present

## 2021-05-14 DIAGNOSIS — L570 Actinic keratosis: Secondary | ICD-10-CM | POA: Diagnosis not present

## 2021-05-22 ENCOUNTER — Other Ambulatory Visit (HOSPITAL_COMMUNITY): Payer: Self-pay

## 2021-05-22 DIAGNOSIS — C171 Malignant neoplasm of jejunum: Secondary | ICD-10-CM | POA: Diagnosis not present

## 2021-05-22 DIAGNOSIS — K50012 Crohn's disease of small intestine with intestinal obstruction: Secondary | ICD-10-CM | POA: Diagnosis not present

## 2021-05-22 DIAGNOSIS — K5080 Crohn's disease of both small and large intestine without complications: Secondary | ICD-10-CM | POA: Diagnosis not present

## 2021-05-22 MED ORDER — CHOLESTYRAMINE 4 G PO PACK
PACK | ORAL | 5 refills | Status: DC
  Filled 2021-05-22: qty 30, 30d supply, fill #0

## 2021-05-24 DIAGNOSIS — K50012 Crohn's disease of small intestine with intestinal obstruction: Secondary | ICD-10-CM | POA: Diagnosis not present

## 2021-05-24 DIAGNOSIS — K602 Anal fissure, unspecified: Secondary | ICD-10-CM | POA: Diagnosis not present

## 2021-05-25 ENCOUNTER — Other Ambulatory Visit: Payer: Self-pay

## 2021-05-25 DIAGNOSIS — D500 Iron deficiency anemia secondary to blood loss (chronic): Secondary | ICD-10-CM

## 2021-05-28 ENCOUNTER — Encounter: Payer: Self-pay | Admitting: Hematology

## 2021-05-28 ENCOUNTER — Inpatient Hospital Stay: Payer: 59

## 2021-05-28 ENCOUNTER — Other Ambulatory Visit: Payer: Self-pay

## 2021-05-28 ENCOUNTER — Inpatient Hospital Stay: Payer: 59 | Attending: Hematology | Admitting: Hematology

## 2021-05-28 VITALS — BP 120/65 | HR 85 | Temp 98.4°F | Resp 18 | Ht 69.0 in | Wt 148.4 lb

## 2021-05-28 DIAGNOSIS — Z79899 Other long term (current) drug therapy: Secondary | ICD-10-CM | POA: Insufficient documentation

## 2021-05-28 DIAGNOSIS — C179 Malignant neoplasm of small intestine, unspecified: Secondary | ICD-10-CM | POA: Insufficient documentation

## 2021-05-28 DIAGNOSIS — K5090 Crohn's disease, unspecified, without complications: Secondary | ICD-10-CM | POA: Insufficient documentation

## 2021-05-28 DIAGNOSIS — K5900 Constipation, unspecified: Secondary | ICD-10-CM | POA: Diagnosis not present

## 2021-05-28 DIAGNOSIS — R197 Diarrhea, unspecified: Secondary | ICD-10-CM | POA: Insufficient documentation

## 2021-05-28 DIAGNOSIS — E079 Disorder of thyroid, unspecified: Secondary | ICD-10-CM | POA: Insufficient documentation

## 2021-05-28 NOTE — Progress Notes (Signed)
[facility]   [phone] [phone]   Clinic Follow up Note   Patient Care Team: Pcp, No as PCP - General Truitt Merle, MD as Consulting Physician (Oncology) Royston Bake, RN as Oncology Nurse Navigator (Oncology)  Date of Service:  05/28/2021  CHIEF COMPLAINT: f/u of small bowel cancer  CURRENT THERAPY:  Surveillance  ASSESSMENT & PLAN:  Jody Chen is a 61 y.o. female with   1. 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 She underwent jejunal resection on 10/11/20 for recurrent Crohn's flare, pathology revealed poorly differentiated carcinoma with medullary features, 10 cm, invading muscularis propria, margins and lymph nodes negative. -baseline CEA 10/25/20 WNL -staging PET scan 10/30/20 showed no residual or metastatic disease. -Given her stage II disease and MSI high, I do not recommend adjuvant chemotherapy -labs from 05/22/21 at [facility] reviewed, overall no concern. -Continue cancer surveillance, plan to see her back in 4 months, and repeat a CT abdomen pelvis in 09/2021 (oral contrast given today)   2.  Crohn's disease -Follow-up with GI Dr. Cecilie Lowers at [facility] -f/u in 4 months, with lab and surveillance CT at GI several days before   No problem-specific Assessment & Plan notes found for this encounter.   SUMMARY OF ONCOLOGIC HISTORY: Oncology History  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[place])  10/11/2020 Cancer Staging   Staging form: Small Intestine - Adenocarcinoma, AJCC 8th Edition - Pathologic stage from 10/11/2020: Stage IIA (pT3, pN0, cM0) - Signed by Truitt Merle, MD on 10/25/2020 Total positive nodes: 0 Total nodes examined: 12 Histologic grade (G): G3 Histologic grading system: 4 grade system Residual tumor (R): R0 - None Tumor location in duodenum: Jejunum Microsatellite instability (MSI): Unstable high    10/24/2020  Initial Diagnosis   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[place])      INTERVAL HISTORY:  Jody Chen is here for a follow up of small bowel cancer. She was last seen by me on 01/22/21. She presents to the clinic alone. She reports she is doing well overall, no new concerns. She reports continued diarrhea, secondary to her Crohn's disease. She does note her daughter is having nonspecific symptoms, including pain in her chest and kidneys and weight loss, and they are undergoing a lot of work up to figure out what's wrong.   All other systems were reviewed with the patient and are negative.  MEDICAL HISTORY:  Past Medical History:  Diagnosis Date   Abdominal distention    Abdominal pain    Cancer ([place])    thyroid   Constipation    Crohn's    Migraines    Thyroid disease     SURGICAL HISTORY: Past Surgical History:  Procedure Laterality Date   BREAST SURGERY     COLON SURGERY     segmental jejunal small bowe resection      THYROIDECTOMY, PARTIAL  11/06/1992    I have reviewed the social history and family history with the patient and they are unchanged from previous note.  ALLERGIES:  is allergic to butorphanol and sulfa antibiotics.  MEDICATIONS:  Current Outpatient Medications  Medication Sig Dispense Refill   ALPRAZolam (XANAX) 0.5 MG tablet TAKE 1 TABLET BY MOUTH ONCE DAILY AS NEEDED FOR ANXIETY. 60 tablet 0   alprazolam (XANAX) 2 MG tablet alprazolam 2 mg tablet  TAKE 1/2 TABLET ORALLY EVERY 8 HOURS AS NEEDED FOR ANXIETY     Azelastine-Fluticasone (  DYMISTA) 137-50 MCG/ACT SUSP Use 1 spray in each nostril twice a day 23 g 5   cholestyramine (QUESTRAN) 4 g packet Take 1 packet (4 g total) by mouth daily. 30 packet 5   escitalopram (LEXAPRO) 10 MG tablet Take 1 tablet by mouth once a day 90 tablet 3   levocetirizine (XYZAL) 5 MG tablet Take 1 tablet by mouth once daily 30 tablet 5   PFIZER COVID-19 VAC BIVALENT injection      progesterone (PROMETRIUM) 100 MG capsule TAKE 1 CAPSULE BY  MOUTH ONCE DAILY AT BEDTIME 90 capsule 3   SUMAtriptan (IMITREX) 100 MG tablet      SUMAtriptan (IMITREX) 50 MG tablet Imitrex 50 mg tablet  Take 1 tablet every 6 hours by oral route as needed for headache.     SYNTHROID 200 MCG tablet Take 1 tablet in the morning on an empty stomach Monday - Saturday 78 tablet 4   SYNTHROID 50 MCG tablet Take 1 tablet by mouth every Sunday morning on an empty stomach 12 tablet 5   terbinafine (LAMISIL) 250 MG tablet Take 1 tablet (250 mg total) by mouth daily. 60 tablet 0   testosterone (ANDROGEL) 50 MG/5GM (1%) GEL Place 5 g onto the skin daily.     valACYclovir (VALTREX) 500 MG tablet Take 1 tablet by mouth 2 times daily for 5 days;  starting 1 day prior to procedure. 10 tablet 0   Estradiol 0.75 MG/1.25 GM (0.06%) topical gel APPLY 1 PUMP EVERY DAY AS DIRECTED 50 g 3   No current facility-administered medications for this visit.    PHYSICAL EXAMINATION: ECOG PERFORMANCE STATUS: 1 - Symptomatic but completely ambulatory  Vitals:   05/28/21 1357  BP: 120/65  Pulse: 85  Resp: 18  Temp: 98.4 F (36.9 C)  SpO2: 100%   Wt Readings from Last 3 Encounters:  05/28/21 148 lb 7 oz (67.3 kg)  04/12/21 135 lb (61.2 kg)  01/22/21 140 lb 1.6 oz (63.5 kg)     GENERAL:alert, no distress and comfortable SKIN: skin color normal, no rashes or significant lesions EYES: normal, Conjunctiva are pink and non-injected, sclera clear  NEURO: alert & oriented x 3 with fluent speech  LABORATORY DATA:  I have reviewed the data as listed CBC Latest Ref Rng & Units 01/22/2021 10/25/2020  WBC 4.0 - 10.5 K/uL 5.2 7.7  Hemoglobin 12.0 - 15.0 g/dL 12.7 9.4(L)  Hematocrit 36.0 - 46.0 % 37.7 30.6(L)  Platelets 150 - 400 K/uL 257 434(H)     CMP Latest Ref Rng & Units 01/22/2021 10/25/2020 02/09/2020  Glucose 70 - 99 mg/dL 93 79 79  BUN 6 - 20 mg/dL 14 17 14   Creatinine 0.44 - 1.00 mg/dL 0.71 0.62 0.69  Sodium 135 - 145 mmol/L 144 142 141  Potassium 3.5 - 5.1 mmol/L 4.2  4.3 4.5  Chloride 98 - 111 mmol/L 110 106 111(H)  CO2 22 - 32 mmol/L 26 26 27   Calcium 8.9 - 10.3 mg/dL 9.1 9.3 8.5(L)  Total Protein 6.5 - 8.1 g/dL 6.8 6.8 4.9(L)  Total Bilirubin 0.3 - 1.2 mg/dL 0.5 0.6 0.3  Alkaline Phos 38 - 126 U/L 101 90 -  AST 15 - 41 U/L 28 20 38(H)  ALT 0 - 44 U/L 24 36 32(H)      RADIOGRAPHIC STUDIES: I have personally reviewed the radiological images as listed and agreed with the findings in the report. No results found.    Orders Placed This Encounter  Procedures  CT Abdomen Pelvis W Contrast    Standing Status:   Future    Standing Expiration Date:   05/28/2022    Order Specific Question:   If indicated for the ordered procedure, I authorize the administration of contrast media per Radiology protocol    Answer:   Yes    Order Specific Question:   Is patient pregnant?    Answer:   No    Order Specific Question:   Preferred imaging location?    Answer:   [facility]    Order Specific Question:   Release to patient    Answer:   Immediate    Order Specific Question:   Is Oral Contrast requested for this exam?    Answer:   Yes, Per Radiology protocol   All questions were answered. The patient knows to call the clinic with any problems, questions or concerns. No barriers to learning was detected.     Truitt Merle, MD 05/28/2021   I, Wilburn Mylar, am acting as scribe for Truitt Merle, MD.   I have reviewed the above documentation for accuracy and completeness, and I agree with the above.

## 2021-05-30 ENCOUNTER — Telehealth: Payer: Self-pay | Admitting: Hematology

## 2021-05-30 NOTE — Telephone Encounter (Signed)
Left message with follow-up appointments per 2/20 los.

## 2021-06-07 ENCOUNTER — Other Ambulatory Visit (HOSPITAL_COMMUNITY): Payer: Self-pay

## 2021-06-07 DIAGNOSIS — K50012 Crohn's disease of small intestine with intestinal obstruction: Secondary | ICD-10-CM | POA: Diagnosis not present

## 2021-06-08 ENCOUNTER — Other Ambulatory Visit (HOSPITAL_COMMUNITY): Payer: Self-pay

## 2021-06-08 MED ORDER — STELARA 90 MG/ML ~~LOC~~ SOSY
PREFILLED_SYRINGE | SUBCUTANEOUS | 5 refills | Status: DC
  Filled 2021-06-08: qty 1, 28d supply, fill #0
  Filled 2021-07-30: qty 1, 56d supply, fill #0

## 2021-06-08 MED ORDER — AZITHROMYCIN 250 MG PO TABS
ORAL_TABLET | ORAL | 0 refills | Status: DC
  Filled 2021-06-08: qty 6, 5d supply, fill #0

## 2021-06-11 ENCOUNTER — Other Ambulatory Visit (HOSPITAL_COMMUNITY): Payer: Self-pay

## 2021-07-02 ENCOUNTER — Other Ambulatory Visit (HOSPITAL_COMMUNITY): Payer: Self-pay

## 2021-07-02 DIAGNOSIS — L570 Actinic keratosis: Secondary | ICD-10-CM | POA: Diagnosis not present

## 2021-07-02 DIAGNOSIS — Z23 Encounter for immunization: Secondary | ICD-10-CM | POA: Diagnosis not present

## 2021-07-02 DIAGNOSIS — D485 Neoplasm of uncertain behavior of skin: Secondary | ICD-10-CM | POA: Diagnosis not present

## 2021-07-02 DIAGNOSIS — C44622 Squamous cell carcinoma of skin of right upper limb, including shoulder: Secondary | ICD-10-CM | POA: Diagnosis not present

## 2021-07-24 DIAGNOSIS — H903 Sensorineural hearing loss, bilateral: Secondary | ICD-10-CM | POA: Diagnosis not present

## 2021-07-30 ENCOUNTER — Other Ambulatory Visit (HOSPITAL_COMMUNITY): Payer: Self-pay

## 2021-07-31 ENCOUNTER — Ambulatory Visit: Payer: 59 | Attending: Internal Medicine | Admitting: Pharmacist

## 2021-07-31 ENCOUNTER — Other Ambulatory Visit (HOSPITAL_COMMUNITY): Payer: Self-pay

## 2021-07-31 ENCOUNTER — Encounter: Payer: Self-pay | Admitting: Pharmacist

## 2021-07-31 DIAGNOSIS — Z79899 Other long term (current) drug therapy: Secondary | ICD-10-CM

## 2021-07-31 MED ORDER — STELARA 90 MG/ML ~~LOC~~ SOSY
PREFILLED_SYRINGE | SUBCUTANEOUS | 5 refills | Status: DC
  Filled 2021-07-31: qty 1, 56d supply, fill #0
  Filled 2021-09-20 (×2): qty 1, 56d supply, fill #1
  Filled 2021-11-12: qty 1, 56d supply, fill #2
  Filled 2021-12-30 – 2022-01-07 (×2): qty 1, 56d supply, fill #3

## 2021-07-31 NOTE — Progress Notes (Signed)
?  S: ?Patient presents for review of their specialty medication therapy. ? ?Patient is currently taking Stelara for Crohn's. Patient is managed by Dr. Renee Harder for this.  ? ?Adherence: confirms. Completed her IV induction phase.  ? ?Efficacy: already notes improvement in control. ? ?Dosing:  ?Crohn disease:  ?Maintenance: SubQ: 90 mg every 8 weeks; begin maintenance dosing 8 weeks after the IV induction dose. ? ?Dose adjustments: ?Renal: no dose adjustments (has not been studied) ?Hepatic: no dose adjustments (has not been studied) ?Special populations: ? Patients >100 kg: May require higher dose to achieve adequate serum levels. ? ?Screening: ?TB test: completed  ?Hepatitis: completed  ? ?Monitoring: ?S/sx of infection: none  ?CBC: wnl ?Reversible posterior leukoencephalopathy syndrome (RPLS - sx include headache, seizures, confusion, and visual disturbances): none  ?Squamous cell skin carcinoma: none ? ?Dosage form specific issues: ? Latex: Packaging may contain natural latex rubber. ? Polysorbate 80: Some dosage forms may contain polysorbate 80 (also known as Tweens). Hypersensitivity reactions, usually a delayed reaction, have been reported following exposure to pharmaceutical products containing polysorbate 80 in certain individuals Dolly Rias, 2002; Lucente 2000; Lollie Marrow, [place]). Thrombocytopenia, ascites, pulmonary deterioration, and renal and hepatic failure have been reported in premature neonates after receiving parenteral products containing polysorbate 80 ([place]ade, 1986; CDC, 1984). See manufacturer?s labeling. ? ? ?O: ?   ? ?Lab Results  ?Component Value Date  ? WBC 5.2 01/22/2021  ? HGB 12.7 01/22/2021  ? HCT 37.7 01/22/2021  ? MCV 88.3 01/22/2021  ? PLT 257 01/22/2021  ? ? ?  Chemistry   ?   ?Component Value Date/Time  ? NA 144 01/22/2021 1358  ? K 4.2 01/22/2021 1358  ? CL 110 01/22/2021 1358  ? CO2 26 01/22/2021 1358  ? BUN 14 01/22/2021 1358  ? CREATININE 0.71 01/22/2021 1358  ? CREATININE 0.69  02/09/2020 1216  ?    ?Component Value Date/Time  ? CALCIUM 9.1 01/22/2021 1358  ? ALKPHOS 101 01/22/2021 1358  ? AST 28 01/22/2021 1358  ? ALT 24 01/22/2021 1358  ? BILITOT 0.5 01/22/2021 1358  ?  ? ?A/P: ?1. Medication review: patient currently on Stelara for Crohn's and is tolerating it well. Reviewed the medication with the patient, including the following: Stelara, ustekinumab, is a TNF? blocker. Patient educated on purpose, proper use and potential adverse effects of Stelara.  Following instruction patient verbalized understanding of treatment plan. There is an increased risk of infection and malignancy with this medication. Do not give patients live vaccinations while they are on this medication. Subcutaneous: Administer by subcutaneous injection into the top of the thigh, abdomen, upper arms, or buttocks. Rotate sites. Do not inject into tender, bruised, erythematous, or indurated skin. Avoid areas of skin where psoriasis is present. Discard any unused portion. Intended for use under supervision of physician; self-injection may occur after proper training. If using the single-dose vial, a 1 mL syringe with a 27-gauge 1/2 inch needle is recommended. No recommendations for any changes. ? ?Benard Halsted, PharmD, BCACP, CPP ?Clinical Pharmacist ?[place] ?[phone] ? ? ? ? ? ? ?

## 2021-08-08 ENCOUNTER — Other Ambulatory Visit (HOSPITAL_COMMUNITY): Payer: Self-pay

## 2021-08-09 ENCOUNTER — Other Ambulatory Visit (HOSPITAL_COMMUNITY): Payer: Self-pay

## 2021-08-09 MED ORDER — ESTROGEL 0.75 MG/1.25 GM (0.06%) TD GEL
TRANSDERMAL | 2 refills | Status: DC
  Filled 2021-08-09: qty 150, 30d supply, fill #0
  Filled 2022-03-07: qty 150, 90d supply, fill #0
  Filled 2022-07-07 – 2022-08-05 (×2): qty 150, 90d supply, fill #1

## 2021-08-10 ENCOUNTER — Other Ambulatory Visit (HOSPITAL_COMMUNITY): Payer: Self-pay

## 2021-08-16 ENCOUNTER — Other Ambulatory Visit (HOSPITAL_COMMUNITY): Payer: Self-pay

## 2021-08-16 MED ORDER — ESTRADIOL 0.25 MG/0.25GM TD GEL
TRANSDERMAL | 2 refills | Status: DC
  Filled 2021-08-16: qty 90, 90d supply, fill #0
  Filled 2021-12-07: qty 90, 90d supply, fill #1
  Filled 2022-03-07: qty 90, 90d supply, fill #2

## 2021-08-17 ENCOUNTER — Other Ambulatory Visit (HOSPITAL_COMMUNITY): Payer: Self-pay

## 2021-09-10 ENCOUNTER — Other Ambulatory Visit (HOSPITAL_COMMUNITY): Payer: Self-pay

## 2021-09-11 DIAGNOSIS — C44622 Squamous cell carcinoma of skin of right upper limb, including shoulder: Secondary | ICD-10-CM | POA: Diagnosis not present

## 2021-09-19 ENCOUNTER — Ambulatory Visit (HOSPITAL_COMMUNITY)
Admission: RE | Admit: 2021-09-19 | Discharge: 2021-09-19 | Disposition: A | Discharge status: Home / Self Care | Payer: 59 | Source: Ambulatory Visit | Attending: Hematology | Admitting: Hematology

## 2021-09-19 DIAGNOSIS — K529 Noninfective gastroenteritis and colitis, unspecified: Secondary | ICD-10-CM | POA: Diagnosis not present

## 2021-09-19 DIAGNOSIS — C179 Malignant neoplasm of small intestine, unspecified: Secondary | ICD-10-CM | POA: Diagnosis not present

## 2021-09-19 DIAGNOSIS — K6389 Other specified diseases of intestine: Secondary | ICD-10-CM | POA: Diagnosis not present

## 2021-09-19 MED ORDER — IOHEXOL 300 MG/ML  SOLN
100.0000 mL | Freq: Once | INTRAMUSCULAR | Status: AC | PRN
  Administered 2021-09-19: 100 mL via INTRAVENOUS

## 2021-09-19 MED ORDER — SODIUM CHLORIDE (PF) 0.9 % IJ SOLN
INTRAMUSCULAR | Status: AC
  Filled 2021-09-19: qty 50

## 2021-09-20 ENCOUNTER — Other Ambulatory Visit: Payer: Self-pay

## 2021-09-20 ENCOUNTER — Other Ambulatory Visit (HOSPITAL_COMMUNITY): Payer: Self-pay

## 2021-09-20 ENCOUNTER — Inpatient Hospital Stay: Payer: 59 | Attending: Hematology

## 2021-09-20 DIAGNOSIS — D500 Iron deficiency anemia secondary to blood loss (chronic): Secondary | ICD-10-CM

## 2021-09-20 DIAGNOSIS — D509 Iron deficiency anemia, unspecified: Secondary | ICD-10-CM | POA: Insufficient documentation

## 2021-09-20 DIAGNOSIS — C179 Malignant neoplasm of small intestine, unspecified: Secondary | ICD-10-CM | POA: Insufficient documentation

## 2021-09-20 DIAGNOSIS — K50912 Crohn's disease, unspecified, with intestinal obstruction: Secondary | ICD-10-CM | POA: Insufficient documentation

## 2021-09-20 LAB — CMP (CANCER CENTER ONLY)
ALT: 13 U/L (ref 0–44)
AST: 16 U/L (ref 15–41)
Albumin: 4 g/dL (ref 3.5–5.0)
Alkaline Phosphatase: 93 U/L (ref 38–126)
Anion gap: 4 — ABNORMAL LOW (ref 5–15)
BUN: 14 mg/dL (ref 6–20)
CO2: 28 mmol/L (ref 22–32)
Calcium: 9.1 mg/dL (ref 8.9–10.3)
Chloride: 105 mmol/L (ref 98–111)
Creatinine: 0.82 mg/dL (ref 0.44–1.00)
GFR, Estimated: >60 mL/min (ref >60)
Glucose, Bld: 139 mg/dL — ABNORMAL HIGH (ref 70–99)
Potassium: 4.1 mmol/L (ref 3.5–5.1)
Sodium: 137 mmol/L (ref 135–145)
Total Bilirubin: 0.5 mg/dL (ref 0.3–1.2)
Total Protein: 7.2 g/dL (ref 6.5–8.1)

## 2021-09-20 LAB — CBC WITH DIFFERENTIAL (CANCER CENTER ONLY)
Abs Immature Granulocytes: 0.01 10*3/uL (ref 0.00–0.07)
Basophils Absolute: 0 10*3/uL (ref 0.0–0.1)
Basophils Relative: 0 %
Eosinophils Absolute: 0.1 10*3/uL (ref 0.0–0.5)
Eosinophils Relative: 1 %
HCT: 35.2 % — ABNORMAL LOW (ref 36.0–46.0)
Hemoglobin: 11.4 g/dL — ABNORMAL LOW (ref 12.0–15.0)
Immature Granulocytes: 0 %
Lymphocytes Relative: 23 %
Lymphs Abs: 1.3 10*3/uL (ref 0.7–4.0)
MCH: 27 pg (ref 26.0–34.0)
MCHC: 32.4 g/dL (ref 30.0–36.0)
MCV: 83.2 fL (ref 80.0–100.0)
Monocytes Absolute: 0.5 10*3/uL (ref 0.1–1.0)
Monocytes Relative: 9 %
Neutro Abs: 4 10*3/uL (ref 1.7–7.7)
Neutrophils Relative %: 67 %
Platelet Count: 321 10*3/uL (ref 150–400)
RBC: 4.23 MIL/uL (ref 3.87–5.11)
RDW: 17.6 % — ABNORMAL HIGH (ref 11.5–15.5)
WBC Count: 5.9 10*3/uL (ref 4.0–10.5)
nRBC: 0 % (ref 0.0–0.2)

## 2021-09-20 LAB — IRON AND IRON BINDING CAPACITY (CC-WL,HP ONLY)
Iron: 36 ug/dL (ref 28–170)
Saturation Ratios: 8 % — ABNORMAL LOW (ref 10.4–31.8)
TIBC: 455 ug/dL — ABNORMAL HIGH (ref 250–450)
UIBC: 419 ug/dL (ref 148–442)

## 2021-09-20 LAB — CEA (IN HOUSE-CHCC): CEA (CHCC-In House): 1.06 ng/mL (ref 0.00–5.00)

## 2021-09-20 LAB — FERRITIN: Ferritin: 5 ng/mL — ABNORMAL LOW (ref 11–307)

## 2021-09-24 ENCOUNTER — Inpatient Hospital Stay: Payer: 59 | Admitting: Hematology

## 2021-09-24 ENCOUNTER — Other Ambulatory Visit (HOSPITAL_COMMUNITY): Payer: Self-pay

## 2021-09-24 ENCOUNTER — Other Ambulatory Visit: Payer: Self-pay

## 2021-09-24 ENCOUNTER — Encounter: Payer: Self-pay | Admitting: Hematology

## 2021-09-24 VITALS — BP 122/77 | HR 69 | Temp 97.9°F | Resp 16 | Ht 69.0 in | Wt 147.4 lb

## 2021-09-24 DIAGNOSIS — C179 Malignant neoplasm of small intestine, unspecified: Secondary | ICD-10-CM | POA: Diagnosis not present

## 2021-09-24 DIAGNOSIS — K50912 Crohn's disease, unspecified, with intestinal obstruction: Secondary | ICD-10-CM | POA: Diagnosis not present

## 2021-09-24 DIAGNOSIS — D509 Iron deficiency anemia, unspecified: Secondary | ICD-10-CM | POA: Diagnosis not present

## 2021-09-24 NOTE — Progress Notes (Signed)
[place]   Telephone:(336) [phone] Fax:(336) [phone]   Clinic Follow up Note   Patient Care Team: Pcp, No as PCP - General Truitt Merle, MD as Consulting Physician (Oncology) Royston Bake, RN as Oncology Nurse Navigator (Oncology)  Date of Service:  09/24/2021  CHIEF COMPLAINT: f/u of small bowel cancer  CURRENT THERAPY:  Surveillance  ASSESSMENT & PLAN:  Jody Chen is a 61 y.o. female with   1. 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 S/p jejunal resection on 10/11/20 for recurrent Crohn's flare, pathology revealed poorly differentiated carcinoma with medullary features, 10 cm, invading muscularis propria, margins and lymph nodes negative. -baseline CEA 10/25/20 WNL -staging PET scan 10/30/20 showed no residual or metastatic disease. -Given her stage II disease and MSI high, I do not recommend adjuvant chemotherapy -restaging CT AP on 09/19/21 showing NED, only acute colitis and functional obstruction which are related to her Crohn disease -She is clinically stable, lab reviewed, no concern for recurrence.   2.  Crohn's disease -Follow-up with GI Dr. Cecilie Lowers at [facility]  3. Mild Anemia secondary to iron deficiency -likely secondary to Crohn's -lab from 09/20/21 showed iron 36, ferritin 5, and hgb 11.4. -We will restart oral iron, monitor her iron level, if no good response to oral iron, will consider IV iron.      Plan -Lab and scan reviewed, NED  -lab and f/u in 3-4 months   No problem-specific Assessment & Plan notes found for this encounter.   SUMMARY OF ONCOLOGIC HISTORY: Oncology History  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[place])  10/11/2020 Cancer Staging   Staging form: Small Intestine - Adenocarcinoma, AJCC 8th Edition - Pathologic stage from 10/11/2020: Stage IIA (pT3, pN0, cM0) - Signed by Truitt Merle, MD on 10/25/2020 Total positive nodes: 0 Total  nodes examined: 12 Histologic grade (G): G3 Histologic grading system: 4 grade system Residual tumor (R): R0 - None Tumor location in duodenum: Jejunum Microsatellite instability (MSI): Unstable high   10/24/2020 Initial Diagnosis   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[place])      INTERVAL HISTORY:  Jody Chen is here for a follow up of small bowel cancer. She was last seen by me on 05/28/21. She presents to the clinic alone. She reports she is doing well overall. She tells me her daughter completed treatment for lymphoma and is in remission.   All other systems were reviewed with the patient and are negative.  MEDICAL HISTORY:  Past Medical History:  Diagnosis Date   Abdominal distention    Abdominal pain    Cancer ([place])    thyroid   Constipation    Crohn's    Migraines    Thyroid disease     SURGICAL HISTORY: Past Surgical History:  Procedure Laterality Date   BREAST SURGERY     COLON SURGERY     segmental jejunal small bowe resection      THYROIDECTOMY, PARTIAL  11/06/1992    I have reviewed the social history and family history with the patient and they are unchanged from previous note.  ALLERGIES:  is allergic to butorphanol and sulfa antibiotics.  MEDICATIONS:  Current Outpatient Medications  Medication Sig Dispense Refill   ALPRAZolam (XANAX) 0.5 MG tablet TAKE 1 TABLET BY MOUTH ONCE DAILY AS NEEDED FOR ANXIETY. 60 tablet 0   alprazolam (XANAX) 2 MG tablet alprazolam 2 mg tablet  TAKE 1/2 TABLET ORALLY EVERY 8 HOURS AS NEEDED FOR  ANXIETY     Azelastine-Fluticasone (DYMISTA) 137-50 MCG/ACT SUSP Use 1 spray in each nostril twice a day 23 g 5   azithromycin (ZITHROMAX Z-PAK) 250 MG tablet Use as directed 6 tablet 0   cholestyramine (QUESTRAN) 4 g packet Take 1 packet (4 g total) by mouth daily. 30 packet 5   escitalopram (LEXAPRO) 10 MG tablet Take 1 tablet by mouth once a day 90 tablet 3   Estradiol (DIVIGEL) 0.25 MG/0.25GM GEL Apply 1 packet topically daily as directed 90  each 2   Estradiol (ESTROGEL) 0.75 MG/1.25 GM (0.06%) topical gel APPLY 1 PUMP EVERY DAY 150 g 2   Estradiol 0.75 MG/1.25 GM (0.06%) topical gel APPLY 1 PUMP EVERY DAY AS DIRECTED 50 g 3   levocetirizine (XYZAL) 5 MG tablet Take 1 tablet by mouth once daily 30 tablet 5   PFIZER COVID-19 VAC BIVALENT injection      progesterone (PROMETRIUM) 100 MG capsule TAKE 1 CAPSULE BY MOUTH ONCE DAILY AT BEDTIME 90 capsule 3   SUMAtriptan (IMITREX) 100 MG tablet      SUMAtriptan (IMITREX) 50 MG tablet Imitrex 50 mg tablet  Take 1 tablet every 6 hours by oral route as needed for headache.     SYNTHROID 200 MCG tablet Take 1 tablet in the morning on an empty stomach Monday - Saturday 78 tablet 4   SYNTHROID 50 MCG tablet Take 1 tablet by mouth every Sunday morning on an empty stomach 12 tablet 5   terbinafine (LAMISIL) 250 MG tablet Take 1 tablet (250 mg total) by mouth daily. 60 tablet 0   testosterone (ANDROGEL) 50 MG/5GM (1%) GEL Place 5 g onto the skin daily.     ustekinumab (STELARA) 90 MG/ML SOSY injection Inject 1 mL (90 mg total) into the skin every 8 weeks. 1 mL 5   valACYclovir (VALTREX) 500 MG tablet Take 1 tablet by mouth 2 times daily for 5 days;  starting 1 day prior to procedure. 10 tablet 0   No current facility-administered medications for this visit.    PHYSICAL EXAMINATION: ECOG PERFORMANCE STATUS: 1 - Symptomatic but completely ambulatory  Vitals:   09/24/21 1438  BP: 122/77  Pulse: 69  Resp: 16  Temp: 97.9 F (36.6 C)  SpO2: 99%   Wt Readings from Last 3 Encounters:  09/24/21 147 lb 6.4 oz (66.9 kg)  05/28/21 148 lb 7 oz (67.3 kg)  04/12/21 135 lb (61.2 kg)     GENERAL:alert, no distress and comfortable SKIN: skin color normal, no rashes or significant lesions EYES: normal, Conjunctiva are pink and non-injected, sclera clear  NEURO: alert & oriented x 3 with fluent speech  LABORATORY DATA:  I have reviewed the data as listed    Latest Ref Rng & Units 09/20/2021     1:55 PM 01/22/2021    1:58 PM 10/25/2020   12:06 PM  CBC  WBC 4.0 - 10.5 K/uL 5.9  5.2  7.7   Hemoglobin 12.0 - 15.0 g/dL 11.4  12.7  9.4   Hematocrit 36.0 - 46.0 % 35.2  37.7  30.6   Platelets 150 - 400 K/uL 321  257  434         Latest Ref Rng & Units 09/20/2021    1:55 PM 01/22/2021    1:58 PM 10/25/2020   12:06 PM  CMP  Glucose 70 - 99 mg/dL 139  93  79   BUN 6 - 20 mg/dL 14  14  17    Creatinine 0.44 -  1.00 mg/dL 0.82  0.71  0.62   Sodium 135 - 145 mmol/L 137  144  142   Potassium 3.5 - 5.1 mmol/L 4.1  4.2  4.3   Chloride 98 - 111 mmol/L 105  110  106   CO2 22 - 32 mmol/L 28  26  26    Calcium 8.9 - 10.3 mg/dL 9.1  9.1  9.3   Total Protein 6.5 - 8.1 g/dL 7.2  6.8  6.8   Total Bilirubin 0.3 - 1.2 mg/dL 0.5  0.5  0.6   Alkaline Phos 38 - 126 U/L 93  101  90   AST 15 - 41 U/L 16  28  20    ALT 0 - 44 U/L 13  24  36       RADIOGRAPHIC STUDIES: I have personally reviewed the radiological images as listed and agreed with the findings in the report. No results found.    No orders of the defined types were placed in this encounter.  All questions were answered. The patient knows to call the clinic with any problems, questions or concerns. No barriers to learning was detected. The total time spent in the appointment was 30 minutes.     Truitt Merle, MD 09/24/2021   I, Wilburn Mylar, am acting as scribe for Truitt Merle, MD.   I have reviewed the above documentation for accuracy and completeness, and I agree with the above.

## 2021-10-05 ENCOUNTER — Other Ambulatory Visit (HOSPITAL_COMMUNITY): Payer: Self-pay

## 2021-10-05 DIAGNOSIS — M25562 Pain in left knee: Secondary | ICD-10-CM | POA: Diagnosis not present

## 2021-10-05 MED ORDER — CELECOXIB 200 MG PO CAPS
ORAL_CAPSULE | ORAL | 0 refills | Status: DC
  Filled 2021-10-05: qty 30, 30d supply, fill #0

## 2021-10-10 DIAGNOSIS — K5080 Crohn's disease of both small and large intestine without complications: Secondary | ICD-10-CM | POA: Diagnosis not present

## 2021-10-10 DIAGNOSIS — R197 Diarrhea, unspecified: Secondary | ICD-10-CM | POA: Diagnosis not present

## 2021-10-10 DIAGNOSIS — Z85068 Personal history of other malignant neoplasm of small intestine: Secondary | ICD-10-CM | POA: Diagnosis not present

## 2021-10-10 DIAGNOSIS — K50812 Crohn's disease of both small and large intestine with intestinal obstruction: Secondary | ICD-10-CM | POA: Diagnosis not present

## 2021-10-11 ENCOUNTER — Other Ambulatory Visit (HOSPITAL_COMMUNITY): Payer: Self-pay

## 2021-10-11 MED ORDER — BUDESONIDE 3 MG PO CPEP
ORAL_CAPSULE | ORAL | 1 refills | Status: DC
  Filled 2021-10-11: qty 90, 30d supply, fill #0
  Filled 2021-11-05: qty 90, 30d supply, fill #1

## 2021-10-12 ENCOUNTER — Other Ambulatory Visit (HOSPITAL_COMMUNITY): Payer: Self-pay

## 2021-10-12 MED ORDER — ESCITALOPRAM OXALATE 10 MG PO TABS
10.0000 mg | ORAL_TABLET | Freq: Every day | ORAL | 2 refills | Status: DC
  Filled 2021-10-12: qty 90, 90d supply, fill #0
  Filled 2022-01-16: qty 90, 90d supply, fill #1
  Filled 2022-04-21: qty 90, 90d supply, fill #2

## 2021-10-19 ENCOUNTER — Other Ambulatory Visit (HOSPITAL_COMMUNITY): Payer: Self-pay

## 2021-10-22 ENCOUNTER — Other Ambulatory Visit (HOSPITAL_COMMUNITY): Payer: Self-pay

## 2021-10-22 MED ORDER — ALPRAZOLAM 0.5 MG PO TABS
0.5000 mg | ORAL_TABLET | Freq: Every day | ORAL | 3 refills | Status: DC | PRN
  Filled 2021-10-22: qty 60, 60d supply, fill #0
  Filled 2021-12-30: qty 60, 60d supply, fill #1
  Filled 2022-03-07: qty 60, 60d supply, fill #2

## 2021-11-05 ENCOUNTER — Other Ambulatory Visit (HOSPITAL_COMMUNITY): Payer: Self-pay

## 2021-11-07 ENCOUNTER — Other Ambulatory Visit (HOSPITAL_COMMUNITY): Payer: Self-pay

## 2021-11-07 MED ORDER — CELECOXIB 200 MG PO CAPS
ORAL_CAPSULE | ORAL | 0 refills | Status: DC
  Filled 2021-11-07: qty 30, 30d supply, fill #0

## 2021-11-12 ENCOUNTER — Other Ambulatory Visit (HOSPITAL_COMMUNITY): Payer: Self-pay

## 2021-11-19 ENCOUNTER — Other Ambulatory Visit (HOSPITAL_COMMUNITY): Payer: Self-pay

## 2021-11-27 DIAGNOSIS — C73 Malignant neoplasm of thyroid gland: Secondary | ICD-10-CM | POA: Diagnosis not present

## 2021-11-27 DIAGNOSIS — K5090 Crohn's disease, unspecified, without complications: Secondary | ICD-10-CM | POA: Diagnosis not present

## 2021-11-27 DIAGNOSIS — M8580 Other specified disorders of bone density and structure, unspecified site: Secondary | ICD-10-CM | POA: Diagnosis not present

## 2021-11-27 DIAGNOSIS — L659 Nonscarring hair loss, unspecified: Secondary | ICD-10-CM | POA: Diagnosis not present

## 2021-11-27 DIAGNOSIS — D509 Iron deficiency anemia, unspecified: Secondary | ICD-10-CM | POA: Diagnosis not present

## 2021-11-27 DIAGNOSIS — E890 Postprocedural hypothyroidism: Secondary | ICD-10-CM | POA: Diagnosis not present

## 2021-11-27 DIAGNOSIS — K909 Intestinal malabsorption, unspecified: Secondary | ICD-10-CM | POA: Diagnosis not present

## 2021-11-29 DIAGNOSIS — M810 Age-related osteoporosis without current pathological fracture: Secondary | ICD-10-CM | POA: Diagnosis not present

## 2021-12-07 ENCOUNTER — Other Ambulatory Visit (HOSPITAL_COMMUNITY): Payer: Self-pay

## 2021-12-07 MED ORDER — BUDESONIDE 3 MG PO CPEP
9.0000 mg | ORAL_CAPSULE | Freq: Every day | ORAL | 0 refills | Status: DC
  Filled 2021-12-07: qty 90, 30d supply, fill #0

## 2021-12-08 ENCOUNTER — Other Ambulatory Visit (HOSPITAL_COMMUNITY): Payer: Self-pay

## 2021-12-11 ENCOUNTER — Other Ambulatory Visit (HOSPITAL_COMMUNITY): Payer: Self-pay

## 2021-12-28 ENCOUNTER — Other Ambulatory Visit: Payer: Self-pay

## 2021-12-28 DIAGNOSIS — C179 Malignant neoplasm of small intestine, unspecified: Secondary | ICD-10-CM

## 2021-12-28 DIAGNOSIS — D500 Iron deficiency anemia secondary to blood loss (chronic): Secondary | ICD-10-CM

## 2021-12-30 ENCOUNTER — Other Ambulatory Visit (HOSPITAL_COMMUNITY): Payer: Self-pay

## 2021-12-31 ENCOUNTER — Inpatient Hospital Stay: Payer: 59 | Attending: Hematology

## 2021-12-31 ENCOUNTER — Other Ambulatory Visit: Payer: Self-pay

## 2021-12-31 ENCOUNTER — Inpatient Hospital Stay: Payer: 59 | Admitting: Hematology

## 2021-12-31 ENCOUNTER — Other Ambulatory Visit (HOSPITAL_COMMUNITY): Payer: Self-pay

## 2021-12-31 ENCOUNTER — Encounter: Payer: Self-pay | Admitting: Hematology

## 2021-12-31 VITALS — BP 108/72 | HR 69 | Temp 98.5°F | Resp 18 | Ht 69.0 in | Wt 142.5 lb

## 2021-12-31 DIAGNOSIS — D500 Iron deficiency anemia secondary to blood loss (chronic): Secondary | ICD-10-CM

## 2021-12-31 DIAGNOSIS — C179 Malignant neoplasm of small intestine, unspecified: Secondary | ICD-10-CM | POA: Insufficient documentation

## 2021-12-31 DIAGNOSIS — K50912 Crohn's disease, unspecified, with intestinal obstruction: Secondary | ICD-10-CM | POA: Insufficient documentation

## 2021-12-31 DIAGNOSIS — D509 Iron deficiency anemia, unspecified: Secondary | ICD-10-CM | POA: Diagnosis not present

## 2021-12-31 LAB — CMP (CANCER CENTER ONLY)
ALT: 11 U/L (ref 0–44)
AST: 15 U/L (ref 15–41)
Albumin: 4 g/dL (ref 3.5–5.0)
Alkaline Phosphatase: 80 U/L (ref 38–126)
Anion gap: 6 (ref 5–15)
BUN: 11 mg/dL (ref 6–20)
CO2: 26 mmol/L (ref 22–32)
Calcium: 9.1 mg/dL (ref 8.9–10.3)
Chloride: 107 mmol/L (ref 98–111)
Creatinine: 0.76 mg/dL (ref 0.44–1.00)
GFR, Estimated: >60 mL/min (ref >60)
Glucose, Bld: 107 mg/dL — ABNORMAL HIGH (ref 70–99)
Potassium: 4 mmol/L (ref 3.5–5.1)
Sodium: 139 mmol/L (ref 135–145)
Total Bilirubin: 0.6 mg/dL (ref 0.3–1.2)
Total Protein: 7.3 g/dL (ref 6.5–8.1)

## 2021-12-31 LAB — IRON AND IRON BINDING CAPACITY (CC-WL,HP ONLY)
Iron: 91 ug/dL (ref 28–170)
Saturation Ratios: 22 % (ref 10.4–31.8)
TIBC: 416 ug/dL (ref 250–450)
UIBC: 325 ug/dL (ref 148–442)

## 2021-12-31 LAB — CBC WITH DIFFERENTIAL (CANCER CENTER ONLY)
Abs Immature Granulocytes: 0.01 10*3/uL (ref 0.00–0.07)
Basophils Absolute: 0 10*3/uL (ref 0.0–0.1)
Basophils Relative: 0 %
Eosinophils Absolute: 0.1 10*3/uL (ref 0.0–0.5)
Eosinophils Relative: 2 %
HCT: 41.9 % (ref 36.0–46.0)
Hemoglobin: 14.1 g/dL (ref 12.0–15.0)
Immature Granulocytes: 0 %
Lymphocytes Relative: 14 %
Lymphs Abs: 1 10*3/uL (ref 0.7–4.0)
MCH: 31.3 pg (ref 26.0–34.0)
MCHC: 33.7 g/dL (ref 30.0–36.0)
MCV: 93.1 fL (ref 80.0–100.0)
Monocytes Absolute: 0.5 10*3/uL (ref 0.1–1.0)
Monocytes Relative: 8 %
Neutro Abs: 5.4 10*3/uL (ref 1.7–7.7)
Neutrophils Relative %: 76 %
Platelet Count: 314 10*3/uL (ref 150–400)
RBC: 4.5 MIL/uL (ref 3.87–5.11)
RDW: 14.2 % (ref 11.5–15.5)
WBC Count: 7.1 10*3/uL (ref 4.0–10.5)
nRBC: 0 % (ref 0.0–0.2)

## 2021-12-31 LAB — CEA (ACCESS): CEA (CHCC): 1.02 ng/mL (ref 0.00–5.00)

## 2021-12-31 LAB — FERRITIN: Ferritin: 11 ng/mL (ref 11–307)

## 2021-12-31 NOTE — Progress Notes (Signed)
[name]   Telephone:(336) [phone] Fax:(336) [phone]   Clinic Follow up Note   Patient Care Team: Pcp, No as PCP - General Truitt Merle, MD as Consulting Physician (Oncology) Royston Bake, RN as Oncology Nurse Navigator (Oncology) Jacelyn Pi, MD as Consulting Physician (Endocrinology) Vladimir Creeks, MD as Consulting Physician (Internal Medicine) Rexene Agent, MD as Consulting Physician (Colon and Rectal Surgery) Dian Queen, MD as Consulting Physician (Obstetrics and Gynecology)  Date of Service:  12/31/2021  CHIEF COMPLAINT: f/u of small bowel cancer  CURRENT THERAPY:  Surveillance  ASSESSMENT & PLAN:  Jody Chen is a 61 y.o. female with   1. 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 S/p jejunal resection on 10/11/20 for recurrent Crohn's flare, pathology revealed poorly differentiated carcinoma with medullary features, 10 cm, invading muscularis propria, margins and lymph nodes negative. -baseline CEA 10/25/20 WNL -staging PET scan 10/30/20 showed no residual or metastatic disease. -no indication for adjuvant chemotherapy -restaging CT AP on 09/19/21 showing NED, only acute colitis and functional obstruction which are related to her Crohn's disease -She is clinically doing well. Lab reviewed, no concern for recurrence. -plan to repeat next CT in 09/2022   2. Mild Anemia secondary to iron deficiency -likely secondary to Crohn's -lab from 09/20/21 showed iron 36, ferritin 5, and hgb 11.4. -hgb has improved to 14.1 with oral iron. Iron panel and ferritin are pending. I encouraged her to continue multivitamin, will reach out with recommendations on oral iron when labs return.  3.  Crohn's disease, Colitis -on stelara for colitis and recently started on Entocort (10/10/21) -Follow-up with GI Dr. Cecilie Lowers at [place] and f/u  in 6 months   No problem-specific Assessment & Plan notes found for this encounter.   SUMMARY OF ONCOLOGIC HISTORY: Oncology History  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[place])  10/11/2020 Cancer Staging   Staging form: Small Intestine - Adenocarcinoma, AJCC 8th Edition - Pathologic stage from 10/11/2020: Stage IIA (pT3, pN0, cM0) - Signed by Truitt Merle, MD on 10/25/2020 Total positive nodes: 0 Total nodes examined: 12 Histologic grade (G): G3 Histologic grading system: 4 grade system Residual tumor (R): R0 - None Tumor location in duodenum: Jejunum Microsatellite instability (MSI): Unstable high   10/24/2020 Initial Diagnosis   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[place])      INTERVAL HISTORY:  Jody Chen is here for a follow up of small bowel cancer. She was last seen by me on 09/24/21. She presents to the clinic alone. She reports she is doing well overall. She denies fatigue. She notes continued bloating and diarrhea secondary to Crohn's.   All other systems were reviewed with the patient and are negative.  MEDICAL HISTORY:  Past Medical History:  Diagnosis Date   Abdominal distention    Abdominal pain    Cancer ([place])    thyroid   Constipation    Crohn's    Migraines    Thyroid disease     SURGICAL HISTORY: Past Surgical History:  Procedure Laterality Date   BREAST SURGERY     COLON SURGERY     segmental jejunal small bowe resection      THYROIDECTOMY, PARTIAL  11/06/1992    I have reviewed the social history and family history with the patient and they are unchanged from previous note.  ALLERGIES:  is allergic to butorphanol and sulfa antibiotics.  MEDICATIONS:  Current Outpatient Medications  Medication Sig Dispense Refill   ALPRAZolam (XANAX) 0.5 MG tablet TAKE 1 TABLET BY MOUTH ONCE DAILY AS NEEDED FOR ANXIETY. 60 tablet 3   alprazolam (XANAX) 2 MG tablet alprazolam 2 mg tablet  TAKE 1/2 TABLET ORALLY EVERY 8 HOURS AS NEEDED FOR ANXIETY     Azelastine-Fluticasone (DYMISTA) 137-50  MCG/ACT SUSP Use 1 spray in each nostril twice a day 23 g 5   azithromycin (ZITHROMAX Z-PAK) 250 MG tablet Use as directed 6 tablet 0   budesonide (ENTOCORT EC) 3 MG 24 hr capsule Take 3 capsules (9 mg total) by mouth daily. 90 capsule 0   celecoxib (CELEBREX) 200 MG capsule Take 1 capsule by mouth once a day with food as needed for pain 30 capsule 0   cholestyramine (QUESTRAN) 4 g packet Take 1 packet (4 g total) by mouth daily. 30 packet 5   escitalopram (LEXAPRO) 10 MG tablet Take 1 tablet by mouth once a day 90 tablet 2   Estradiol (DIVIGEL) 0.25 MG/0.25GM GEL Apply 1 packet topically daily as directed 90 each 2   Estradiol (ESTROGEL) 0.75 MG/1.25 GM (0.06%) topical gel APPLY 1 PUMP EVERY DAY 150 g 2   Estradiol 0.75 MG/1.25 GM (0.06%) topical gel APPLY 1 PUMP EVERY DAY AS DIRECTED 50 g 3   levocetirizine (XYZAL) 5 MG tablet Take 1 tablet by mouth once daily 30 tablet 5   PFIZER COVID-19 VAC BIVALENT injection      progesterone (PROMETRIUM) 100 MG capsule TAKE 1 CAPSULE BY MOUTH ONCE DAILY AT BEDTIME 90 capsule 3   SUMAtriptan (IMITREX) 100 MG tablet      SUMAtriptan (IMITREX) 50 MG tablet Imitrex 50 mg tablet  Take 1 tablet every 6 hours by oral route as needed for headache.     SYNTHROID 200 MCG tablet Take 1 tablet in the morning on an empty stomach Monday - Saturday 78 tablet 4   SYNTHROID 50 MCG tablet Take 1 tablet by mouth every Sunday morning on an empty stomach 12 tablet 5   terbinafine (LAMISIL) 250 MG tablet Take 1 tablet (250 mg total) by mouth daily. 60 tablet 0   testosterone (ANDROGEL) 50 MG/5GM (1%) GEL Place 5 g onto the skin daily.     ustekinumab (STELARA) 90 MG/ML SOSY injection Inject 1 mL (90 mg total) into the skin every 8 weeks. 1 mL 5   valACYclovir (VALTREX) 500 MG tablet Take 1 tablet by mouth 2 times daily for 5 days;  starting 1 day prior to procedure. 10 tablet 0   No current facility-administered medications for this visit.    PHYSICAL EXAMINATION: ECOG  PERFORMANCE STATUS: 1 - Symptomatic but completely ambulatory  Vitals:   12/31/21 1410  BP: 108/72  Pulse: 69  Resp: 18  Temp: 98.5 F (36.9 C)  SpO2: 98%   Wt Readings from Last 3 Encounters:  12/31/21 142 lb 8 oz (64.6 kg)  09/24/21 147 lb 6.4 oz (66.9 kg)  05/28/21 148 lb 7 oz (67.3 kg)     GENERAL:alert, no distress and comfortable SKIN: skin color normal, no rashes or significant lesions EYES: normal, Conjunctiva are pink and non-injected, sclera clear  NEURO: alert & oriented x 3 with fluent speech  LABORATORY DATA:  I have reviewed the data as listed    Latest Ref Rng & Units 12/31/2021    1:56 PM 09/20/2021    1:55 PM 01/22/2021    1:58 PM  CBC  WBC 4.0 - 10.5 K/uL 7.1  5.9  5.2   Hemoglobin 12.0 - 15.0 g/dL 14.1  11.4  12.7   Hematocrit 36.0 - 46.0 % 41.9  35.2  37.7   Platelets 150 - 400 K/uL 314  321  257         Latest Ref Rng & Units 12/31/2021    1:56 PM 09/20/2021    1:55 PM 01/22/2021    1:58 PM  CMP  Glucose 70 - 99 mg/dL 107  139  93   BUN 6 - 20 mg/dL 11  14  14    Creatinine 0.44 - 1.00 mg/dL 0.76  0.82  0.71   Sodium 135 - 145 mmol/L 139  137  144   Potassium 3.5 - 5.1 mmol/L 4.0  4.1  4.2   Chloride 98 - 111 mmol/L 107  105  110   CO2 22 - 32 mmol/L 26  28  26    Calcium 8.9 - 10.3 mg/dL 9.1  9.1  9.1   Total Protein 6.5 - 8.1 g/dL 7.3  7.2  6.8   Total Bilirubin 0.3 - 1.2 mg/dL 0.6  0.5  0.5   Alkaline Phos 38 - 126 U/L 80  93  101   AST 15 - 41 U/L 15  16  28    ALT 0 - 44 U/L 11  13  24        RADIOGRAPHIC STUDIES: I have personally reviewed the radiological images as listed and agreed with the findings in the report. No results found.    No orders of the defined types were placed in this encounter.  All questions were answered. The patient knows to call the clinic with any problems, questions or concerns. No barriers to learning was detected. The total time spent in the appointment was 25 minutes.     Truitt Merle, MD 12/31/2021    I, Wilburn Mylar, am acting as scribe for Truitt Merle, MD.   I have reviewed the above documentation for accuracy and completeness, and I agree with the above.

## 2022-01-01 ENCOUNTER — Encounter: Payer: Self-pay | Admitting: Hematology

## 2022-01-02 DIAGNOSIS — M810 Age-related osteoporosis without current pathological fracture: Secondary | ICD-10-CM | POA: Diagnosis not present

## 2022-01-03 ENCOUNTER — Other Ambulatory Visit (HOSPITAL_COMMUNITY): Payer: Self-pay

## 2022-01-07 ENCOUNTER — Other Ambulatory Visit (HOSPITAL_COMMUNITY): Payer: Self-pay

## 2022-01-09 ENCOUNTER — Other Ambulatory Visit (HOSPITAL_COMMUNITY): Payer: Self-pay

## 2022-01-14 ENCOUNTER — Other Ambulatory Visit (HOSPITAL_COMMUNITY): Payer: Self-pay

## 2022-01-16 ENCOUNTER — Other Ambulatory Visit (HOSPITAL_COMMUNITY): Payer: Self-pay

## 2022-01-16 MED ORDER — SYNTHROID 200 MCG PO TABS
200.0000 ug | ORAL_TABLET | Freq: Every morning | ORAL | 4 refills | Status: DC
  Filled 2022-01-16: qty 78, 90d supply, fill #0
  Filled 2022-04-21: qty 78, 90d supply, fill #1
  Filled 2022-08-05: qty 78, 90d supply, fill #2
  Filled 2022-10-15: qty 78, 90d supply, fill #3

## 2022-01-18 ENCOUNTER — Telehealth: Payer: Self-pay | Admitting: Hematology

## 2022-01-18 DIAGNOSIS — C44622 Squamous cell carcinoma of skin of right upper limb, including shoulder: Secondary | ICD-10-CM | POA: Diagnosis not present

## 2022-01-18 DIAGNOSIS — L570 Actinic keratosis: Secondary | ICD-10-CM | POA: Diagnosis not present

## 2022-01-18 DIAGNOSIS — D2271 Melanocytic nevi of right lower limb, including hip: Secondary | ICD-10-CM | POA: Diagnosis not present

## 2022-01-18 DIAGNOSIS — D485 Neoplasm of uncertain behavior of skin: Secondary | ICD-10-CM | POA: Diagnosis not present

## 2022-01-18 DIAGNOSIS — Z85828 Personal history of other malignant neoplasm of skin: Secondary | ICD-10-CM | POA: Diagnosis not present

## 2022-01-18 DIAGNOSIS — D2272 Melanocytic nevi of left lower limb, including hip: Secondary | ICD-10-CM | POA: Diagnosis not present

## 2022-01-18 DIAGNOSIS — Z86018 Personal history of other benign neoplasm: Secondary | ICD-10-CM | POA: Diagnosis not present

## 2022-01-18 DIAGNOSIS — D225 Melanocytic nevi of trunk: Secondary | ICD-10-CM | POA: Diagnosis not present

## 2022-01-18 DIAGNOSIS — L578 Other skin changes due to chronic exposure to nonionizing radiation: Secondary | ICD-10-CM | POA: Diagnosis not present

## 2022-01-18 DIAGNOSIS — D2372 Other benign neoplasm of skin of left lower limb, including hip: Secondary | ICD-10-CM | POA: Diagnosis not present

## 2022-01-18 NOTE — Telephone Encounter (Signed)
Scheduled follow-up appointment per 9/25 los. Patient is aware.

## 2022-02-04 ENCOUNTER — Other Ambulatory Visit (HOSPITAL_COMMUNITY): Payer: Self-pay

## 2022-02-04 DIAGNOSIS — K50812 Crohn's disease of both small and large intestine with intestinal obstruction: Secondary | ICD-10-CM | POA: Diagnosis not present

## 2022-02-04 DIAGNOSIS — Z85068 Personal history of other malignant neoplasm of small intestine: Secondary | ICD-10-CM | POA: Diagnosis not present

## 2022-02-04 MED ORDER — PREDNISONE 10 MG PO TABS
ORAL_TABLET | ORAL | 0 refills | Status: DC
  Filled 2022-02-04: qty 100, 35d supply, fill #0

## 2022-02-05 DIAGNOSIS — K50812 Crohn's disease of both small and large intestine with intestinal obstruction: Secondary | ICD-10-CM | POA: Diagnosis not present

## 2022-02-19 DIAGNOSIS — C44622 Squamous cell carcinoma of skin of right upper limb, including shoulder: Secondary | ICD-10-CM | POA: Diagnosis not present

## 2022-02-22 ENCOUNTER — Other Ambulatory Visit (HOSPITAL_COMMUNITY): Payer: Self-pay

## 2022-02-22 DIAGNOSIS — K50812 Crohn's disease of both small and large intestine with intestinal obstruction: Secondary | ICD-10-CM | POA: Diagnosis not present

## 2022-02-22 DIAGNOSIS — Z713 Dietary counseling and surveillance: Secondary | ICD-10-CM | POA: Diagnosis not present

## 2022-02-22 MED ORDER — PREDNISONE 10 MG PO TABS
ORAL_TABLET | ORAL | 0 refills | Status: AC
  Filled 2022-02-22 – 2022-03-07 (×2): qty 70, 56d supply, fill #0

## 2022-03-01 ENCOUNTER — Other Ambulatory Visit (HOSPITAL_COMMUNITY): Payer: Self-pay

## 2022-03-05 ENCOUNTER — Other Ambulatory Visit (HOSPITAL_COMMUNITY): Payer: Self-pay

## 2022-03-06 ENCOUNTER — Other Ambulatory Visit (HOSPITAL_COMMUNITY): Payer: Self-pay

## 2022-03-07 ENCOUNTER — Other Ambulatory Visit (HOSPITAL_COMMUNITY): Payer: Self-pay

## 2022-03-07 DIAGNOSIS — K50012 Crohn's disease of small intestine with intestinal obstruction: Secondary | ICD-10-CM | POA: Diagnosis not present

## 2022-03-09 ENCOUNTER — Other Ambulatory Visit (HOSPITAL_COMMUNITY): Payer: Self-pay

## 2022-03-11 ENCOUNTER — Other Ambulatory Visit (HOSPITAL_COMMUNITY): Payer: Self-pay

## 2022-03-25 ENCOUNTER — Other Ambulatory Visit (HOSPITAL_COMMUNITY): Payer: Self-pay

## 2022-03-25 ENCOUNTER — Other Ambulatory Visit: Payer: Self-pay

## 2022-03-26 ENCOUNTER — Other Ambulatory Visit (HOSPITAL_COMMUNITY): Payer: Self-pay

## 2022-04-04 DIAGNOSIS — K50012 Crohn's disease of small intestine with intestinal obstruction: Secondary | ICD-10-CM | POA: Diagnosis not present

## 2022-04-15 ENCOUNTER — Other Ambulatory Visit (HOSPITAL_COMMUNITY): Payer: Self-pay

## 2022-04-15 MED ORDER — VALACYCLOVIR HCL 1 G PO TABS
1000.0000 mg | ORAL_TABLET | Freq: Every day | ORAL | 1 refills | Status: DC
  Filled 2022-04-15: qty 3, 3d supply, fill #0
  Filled 2022-04-21: qty 3, 3d supply, fill #1

## 2022-04-22 ENCOUNTER — Other Ambulatory Visit (HOSPITAL_COMMUNITY): Payer: Self-pay

## 2022-04-30 DIAGNOSIS — Z6821 Body mass index (BMI) 21.0-21.9, adult: Secondary | ICD-10-CM | POA: Diagnosis not present

## 2022-04-30 DIAGNOSIS — Z01419 Encounter for gynecological examination (general) (routine) without abnormal findings: Secondary | ICD-10-CM | POA: Diagnosis not present

## 2022-04-30 DIAGNOSIS — Z1231 Encounter for screening mammogram for malignant neoplasm of breast: Secondary | ICD-10-CM | POA: Diagnosis not present

## 2022-05-06 ENCOUNTER — Other Ambulatory Visit (HOSPITAL_COMMUNITY): Payer: Self-pay

## 2022-05-06 ENCOUNTER — Ambulatory Visit: Payer: Commercial Managed Care - PPO | Attending: Internal Medicine | Admitting: Pharmacist

## 2022-05-06 ENCOUNTER — Telehealth: Payer: Self-pay | Admitting: Pharmacist

## 2022-05-06 DIAGNOSIS — Z79899 Other long term (current) drug therapy: Secondary | ICD-10-CM

## 2022-05-06 MED ORDER — SKYRIZI 360 MG/2.4ML ~~LOC~~ SOCT
SUBCUTANEOUS | 4 refills | Status: DC
  Filled 2022-05-06: qty 2.4, 56d supply, fill #0
  Filled 2022-05-06: qty 2.4, 28d supply, fill #0

## 2022-05-06 MED ORDER — SKYRIZI 360 MG/2.4ML ~~LOC~~ SOCT
SUBCUTANEOUS | 4 refills | Status: DC
  Filled 2022-05-06: qty 2.4, fill #0
  Filled 2022-05-07: qty 2.4, 56d supply, fill #0
  Filled 2022-07-10: qty 2.4, 56d supply, fill #1
  Filled 2022-09-16 – 2022-09-18 (×2): qty 2.4, 56d supply, fill #2
  Filled 2022-11-15: qty 2.4, 56d supply, fill #3
  Filled 2023-01-20: qty 2.4, 56d supply, fill #4

## 2022-05-06 NOTE — Progress Notes (Signed)
   S: Patient presents for review of their specialty medication therapy.  Patient is currently taking Skyrizi for [employer]. Patient is managed by Dr. Renee Harder for this.   Adherence: confirmed. Completed her IV induction phase today.   Efficacy: can already tell a difference in symptoms  Dosing: 360 mg total subcutaneous once every 8 weeks   Monitoring: S/sx of infection: none S/sx of hypersensitivity/injection site reaction: none  O: Lab Results  Component Value Date   WBC 7.1 12/31/2021   HGB 14.1 12/31/2021   HCT 41.9 12/31/2021   MCV 93.1 12/31/2021   PLT 314 12/31/2021      Chemistry      Component Value Date/Time   NA 139 12/31/2021 1356   K 4.0 12/31/2021 1356   CL 107 12/31/2021 1356   CO2 26 12/31/2021 1356   BUN 11 12/31/2021 1356   CREATININE 0.76 12/31/2021 1356   CREATININE 0.69 02/09/2020 1216      Component Value Date/Time   CALCIUM 9.1 12/31/2021 1356   ALKPHOS 80 12/31/2021 1356   AST 15 12/31/2021 1356   ALT 11 12/31/2021 1356   BILITOT 0.6 12/31/2021 1356       A/P: 1. Medication review: Patient currently on [place] for Crohn's. Reviewed the medication with the patient, including the following: Orson Ape is a monoclonal antibody used in the treatment of Crohn's. Patient educated on purpose, proper use and potential adverse effects of Skyrizi. Possible adverse effects are infections, headache, and injection site reactions. Live vaccinations should be avoided while on therapy. SubQ: Administer the two consecutive injections subcutaneously at different anatomic locations, such as thighs, abdomen, or back of upper arms. Intended for use under supervision of a health care professional; self-injection may occur after proper training (except back of upper arms). No recommendations for any changes at this time.  Benard Halsted, PharmD, [place], [place] [phone]

## 2022-05-06 NOTE — Telephone Encounter (Signed)
Called patient to schedule an appointment for the [employer] [facility]. I was unable to reach the patient so I left a HIPAA-compliant message requesting that the patient return my call.   Luke Van Ausdall, PharmD, BCACP, CPP Clinical Pharmacist [facility] [phone]  

## 2022-05-07 ENCOUNTER — Other Ambulatory Visit (HOSPITAL_COMMUNITY): Payer: Self-pay

## 2022-05-14 DIAGNOSIS — Z85068 Personal history of other malignant neoplasm of small intestine: Secondary | ICD-10-CM | POA: Diagnosis not present

## 2022-05-14 DIAGNOSIS — K50012 Crohn's disease of small intestine with intestinal obstruction: Secondary | ICD-10-CM | POA: Diagnosis not present

## 2022-05-21 ENCOUNTER — Other Ambulatory Visit: Payer: Self-pay

## 2022-05-23 ENCOUNTER — Other Ambulatory Visit (HOSPITAL_COMMUNITY): Payer: Self-pay

## 2022-05-28 ENCOUNTER — Other Ambulatory Visit (HOSPITAL_COMMUNITY): Payer: Self-pay

## 2022-05-29 ENCOUNTER — Other Ambulatory Visit (HOSPITAL_COMMUNITY): Payer: Self-pay

## 2022-06-12 ENCOUNTER — Other Ambulatory Visit (HOSPITAL_COMMUNITY): Payer: Self-pay

## 2022-06-12 ENCOUNTER — Other Ambulatory Visit: Payer: Self-pay

## 2022-06-12 MED ORDER — ALPRAZOLAM 0.5 MG PO TABS
0.5000 mg | ORAL_TABLET | Freq: Every day | ORAL | 3 refills | Status: DC | PRN
  Filled 2022-06-12: qty 60, 60d supply, fill #0
  Filled 2022-11-08: qty 60, 60d supply, fill #1

## 2022-06-12 MED ORDER — PROGESTERONE MICRONIZED 100 MG PO CAPS
100.0000 mg | ORAL_CAPSULE | Freq: Every day | ORAL | 3 refills | Status: DC
  Filled 2022-06-12: qty 90, 90d supply, fill #0
  Filled 2022-09-18: qty 90, 90d supply, fill #1
  Filled 2022-12-25: qty 90, 90d supply, fill #2
  Filled 2023-03-28: qty 90, 90d supply, fill #3

## 2022-06-12 MED ORDER — SYNTHROID 50 MCG PO TABS
50.0000 ug | ORAL_TABLET | ORAL | 5 refills | Status: DC
  Filled 2022-06-12: qty 12, 84d supply, fill #0
  Filled 2022-07-30 – 2022-09-18 (×2): qty 12, 84d supply, fill #1
  Filled 2022-12-10 – 2023-01-28 (×2): qty 12, 84d supply, fill #2

## 2022-06-13 ENCOUNTER — Other Ambulatory Visit: Payer: Self-pay

## 2022-06-19 ENCOUNTER — Other Ambulatory Visit: Payer: Self-pay | Admitting: Podiatry

## 2022-06-19 MED ORDER — NITROGLYCERIN 0.4 MG/HR TD PT24: 0.4000 mg | MEDICATED_PATCH | Freq: Every day | TRANSDERMAL | 12 refills | Status: DC

## 2022-06-19 NOTE — Progress Notes (Signed)
Spoke with patient currently having issues with Achilles tendonitis, previously had relief with nitroglycerin patches, Rx sent to pharmacy

## 2022-06-20 DIAGNOSIS — H903 Sensorineural hearing loss, bilateral: Secondary | ICD-10-CM | POA: Diagnosis not present

## 2022-06-20 DIAGNOSIS — C73 Malignant neoplasm of thyroid gland: Secondary | ICD-10-CM | POA: Diagnosis not present

## 2022-06-20 DIAGNOSIS — K5090 Crohn's disease, unspecified, without complications: Secondary | ICD-10-CM | POA: Diagnosis not present

## 2022-06-20 DIAGNOSIS — H9313 Tinnitus, bilateral: Secondary | ICD-10-CM | POA: Diagnosis not present

## 2022-06-25 ENCOUNTER — Other Ambulatory Visit (HOSPITAL_COMMUNITY): Payer: Self-pay

## 2022-06-25 ENCOUNTER — Other Ambulatory Visit: Payer: Self-pay

## 2022-06-25 DIAGNOSIS — M810 Age-related osteoporosis without current pathological fracture: Secondary | ICD-10-CM | POA: Insufficient documentation

## 2022-06-25 MED ORDER — ACYCLOVIR 200 MG PO CAPS
600.0000 mg | ORAL_CAPSULE | ORAL | 2 refills | Status: DC
  Filled 2022-06-25 – 2022-07-05 (×2): qty 38, 7d supply, fill #0

## 2022-06-26 ENCOUNTER — Inpatient Hospital Stay: Payer: Commercial Managed Care - PPO | Attending: Hematology | Admitting: Hematology

## 2022-06-26 ENCOUNTER — Other Ambulatory Visit: Payer: Self-pay

## 2022-06-26 ENCOUNTER — Inpatient Hospital Stay: Payer: Commercial Managed Care - PPO

## 2022-06-26 ENCOUNTER — Encounter: Payer: Self-pay | Admitting: Hematology

## 2022-06-26 VITALS — BP 119/71 | HR 74 | Temp 97.8°F | Resp 18 | Ht 69.0 in | Wt 149.3 lb

## 2022-06-26 DIAGNOSIS — D500 Iron deficiency anemia secondary to blood loss (chronic): Secondary | ICD-10-CM

## 2022-06-26 DIAGNOSIS — C179 Malignant neoplasm of small intestine, unspecified: Secondary | ICD-10-CM | POA: Diagnosis not present

## 2022-06-26 DIAGNOSIS — K50012 Crohn's disease of small intestine with intestinal obstruction: Secondary | ICD-10-CM | POA: Insufficient documentation

## 2022-06-26 DIAGNOSIS — Z85068 Personal history of other malignant neoplasm of small intestine: Secondary | ICD-10-CM | POA: Diagnosis not present

## 2022-06-26 DIAGNOSIS — K5000 Crohn's disease of small intestine without complications: Secondary | ICD-10-CM | POA: Diagnosis not present

## 2022-06-26 DIAGNOSIS — Z79899 Other long term (current) drug therapy: Secondary | ICD-10-CM | POA: Diagnosis not present

## 2022-06-26 LAB — CMP (CANCER CENTER ONLY)
ALT: 16 U/L (ref 0–44)
AST: 19 U/L (ref 15–41)
Albumin: 4.2 g/dL (ref 3.5–5.0)
Alkaline Phosphatase: 69 U/L (ref 38–126)
Anion gap: 5 (ref 5–15)
BUN: 15 mg/dL (ref 6–20)
CO2: 28 mmol/L (ref 22–32)
Calcium: 9.3 mg/dL (ref 8.9–10.3)
Chloride: 107 mmol/L (ref 98–111)
Creatinine: 0.78 mg/dL (ref 0.44–1.00)
GFR, Estimated: >60 mL/min (ref >60)
Glucose, Bld: 128 mg/dL — ABNORMAL HIGH (ref 70–99)
Potassium: 3.9 mmol/L (ref 3.5–5.1)
Sodium: 140 mmol/L (ref 135–145)
Total Bilirubin: 0.6 mg/dL (ref 0.3–1.2)
Total Protein: 7 g/dL (ref 6.5–8.1)

## 2022-06-26 LAB — CBC WITH DIFFERENTIAL (CANCER CENTER ONLY)
Abs Immature Granulocytes: 0.02 10*3/uL (ref 0.00–0.07)
Basophils Absolute: 0 10*3/uL (ref 0.0–0.1)
Basophils Relative: 0 %
Eosinophils Absolute: 0.1 10*3/uL (ref 0.0–0.5)
Eosinophils Relative: 1 %
HCT: 42.3 % (ref 36.0–46.0)
Hemoglobin: 14.7 g/dL (ref 12.0–15.0)
Immature Granulocytes: 0 %
Lymphocytes Relative: 14 %
Lymphs Abs: 1.3 10*3/uL (ref 0.7–4.0)
MCH: 33.5 pg (ref 26.0–34.0)
MCHC: 34.8 g/dL (ref 30.0–36.0)
MCV: 96.4 fL (ref 80.0–100.0)
Monocytes Absolute: 0.5 10*3/uL (ref 0.1–1.0)
Monocytes Relative: 5 %
Neutro Abs: 7.1 10*3/uL (ref 1.7–7.7)
Neutrophils Relative %: 80 %
Platelet Count: 273 10*3/uL (ref 150–400)
RBC: 4.39 MIL/uL (ref 3.87–5.11)
RDW: 13.2 % (ref 11.5–15.5)
WBC Count: 9 10*3/uL (ref 4.0–10.5)
nRBC: 0 % (ref 0.0–0.2)

## 2022-06-26 LAB — IRON AND IRON BINDING CAPACITY (CC-WL,HP ONLY)
Iron: 129 ug/dL (ref 28–170)
Saturation Ratios: 34 % — ABNORMAL HIGH (ref 10.4–31.8)
TIBC: 384 ug/dL (ref 250–450)
UIBC: 255 ug/dL (ref 148–442)

## 2022-06-26 LAB — CEA (ACCESS): CEA (CHCC): <1 ng/mL (ref 0.00–5.00)

## 2022-06-26 LAB — FERRITIN: Ferritin: 22 ng/mL (ref 11–307)

## 2022-06-26 NOTE — Assessment & Plan Note (Signed)
on stelara for colitis and recently started on Entocort (10/10/21) -Follow-up with GI Dr. Cecilie Lowers at [facility]

## 2022-06-26 NOTE — Assessment & Plan Note (Signed)
pT3N0M0 stage IIA, MSI-H -Presented with intermittent partial small bowel obstruction. S/p jejunal resection on 10/11/20 for recurrent Crohn's flare, pathology revealed poorly differentiated carcinoma with medullary features, 10 cm, invading muscularis propria, margins and lymph nodes negative. -baseline CEA 10/25/20 WNL -staging PET scan 10/30/20 showed no residual or metastatic disease. -no indication for adjuvant chemotherapy -restaging CT AP on 09/19/21 showing NED, only acute colitis and functional obstruction which are related to her Crohn's disease

## 2022-06-26 NOTE — Progress Notes (Signed)
[place]   [phone]) [phone] [phone]   Clinic Follow up Note   Patient Care Team: Pcp, No as PCP - General Truitt Merle, MD as Consulting Physician (Oncology) Earl Gala, Deliah Goody, RN as Oncology Nurse Navigator (Oncology) Jacelyn Pi, MD as Consulting Physician (Endocrinology) Vladimir Creeks, MD as Consulting Physician (Internal Medicine) Rexene Agent, MD as Consulting Physician (Colon and Rectal Surgery) Dian Queen, MD as Consulting Physician (Obstetrics and Gynecology)  Date of Service:  06/26/2022  CHIEF COMPLAINT: f/u of small bowel cancer   CURRENT THERAPY: Surveillance    ASSESSMENT:  Jody Chen is a 62 y.o. 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 S/p jejunal resection on 10/11/20 for recurrent Crohn's flare, pathology revealed poorly differentiated carcinoma with medullary features, 10 cm, invading muscularis propria, margins and lymph nodes negative. -baseline CEA 10/25/20 WNL -staging PET scan 10/30/20 showed no residual or metastatic disease. -no indication for adjuvant chemotherapy -restaging CT AP on 09/19/21 showing NED, only acute colitis and functional obstruction which are related to her Crohn's disease   Crohn's disease of ileum on stelara for colitis and recently started on Entocort (10/10/21) -Follow-up with GI Dr. Cecilie Lowers at [facility]  -She is now on Skyrizi and her Crohn's disease is better controlled.    PLAN: -She is clinically doing well, lab reviewed, exam was unremarkable, there is no clinical concern for recurrence. -lab reviewed -Order CT scan to be done before next visit  -lab,f/u in 3 months     SUMMARY OF ONCOLOGIC HISTORY: Oncology History Overview Note   Cancer Staging  Small bowel cancer [facility]) Staging form: Small Intestine - Adenocarcinoma, AJCC 8th Edition - Pathologic stage from 10/11/2020: Stage IIA (pT3,  pN0, cM0) - Signed by Truitt Merle, MD on 10/25/2020 Total positive nodes: 0 Total nodes examined: 12 Histologic grade (G): G3 Histologic grading system: 4 grade system Residual tumor (R): R0 - None Tumor location in duodenum: Jejunum Microsatellite instability (MSI): Unstable high     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[place])  10/11/2020 Cancer Staging   Staging form: Small Intestine - Adenocarcinoma, AJCC 8th Edition - Pathologic stage from 10/11/2020: Stage IIA (pT3, pN0, cM0) - Signed by Truitt Merle, MD on 10/25/2020 Total positive nodes: 0 Total nodes examined: 12 Histologic grade (G): G3 Histologic grading system: 4 grade system Residual tumor (R): R0 - None Tumor location in duodenum: Jejunum Microsatellite instability (MSI): Unstable high   10/24/2020 Initial Diagnosis   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[place])      INTERVAL HISTORY:  Jody Chen is here for a follow up of small bowel cancer  She was last seen by me on 12/31/2021 She presents to the clinic alone. Pt state that she is doing well. Pt report that she started [place] and she is doing well with it.     All other systems were reviewed with the patient and are negative.  MEDICAL HISTORY:  Past Medical History:  Diagnosis Date   Abdominal distention    Abdominal pain    Cancer ([place])    thyroid   Constipation    Crohn's    Migraines    Thyroid disease     SURGICAL HISTORY: Past Surgical History:  Procedure Laterality Date   BREAST SURGERY     COLON SURGERY     segmental jejunal small bowe resection      THYROIDECTOMY, PARTIAL  11/06/1992    I have reviewed the social  history and family history with the patient and they are unchanged from previous note.  ALLERGIES:  is allergic to butorphanol and sulfa antibiotics.  MEDICATIONS:  Current Outpatient Medications  Medication Sig Dispense Refill   nitroGLYCERIN (NITRODUR - DOSED IN MG/24 HR) 0.4 mg/hr patch Place 1 patch (0.4 mg total) onto the skin daily. 30 patch 12   acyclovir  (ZOVIRAX) 200 MG capsule Take 3 capsules (600 mg total) by mouth now then 1 capsule 5 times a day 38 capsule 2   ALPRAZolam (XANAX) 0.5 MG tablet Take 1 tablet (0.5 mg total) by mouth daily as needed for anxiety 60 tablet 3   alprazolam (XANAX) 2 MG tablet alprazolam 2 mg tablet  TAKE 1/2 TABLET ORALLY EVERY 8 HOURS AS NEEDED FOR ANXIETY     Azelastine-Fluticasone (DYMISTA) 137-50 MCG/ACT SUSP Use 1 spray in each nostril twice a day 23 g 5   azithromycin (ZITHROMAX Z-PAK) 250 MG tablet Use as directed 6 tablet 0   budesonide (ENTOCORT EC) 3 MG 24 hr capsule Take 3 capsules (9 mg total) by mouth daily. 90 capsule 0   celecoxib (CELEBREX) 200 MG capsule Take 1 capsule by mouth once a day with food as needed for pain 30 capsule 0   cholestyramine (QUESTRAN) 4 g packet Take 1 packet (4 g total) by mouth daily. 30 packet 5   escitalopram (LEXAPRO) 10 MG tablet Take 1 tablet by mouth once a day 90 tablet 2   Estradiol (DIVIGEL) 0.25 MG/0.25GM GEL Apply 1 packet topically daily as directed 90 each 2   Estradiol (ESTROGEL) 0.75 MG/1.25 GM (0.06%) topical gel APPLY 1 PUMP TOPICALLY EVERY DAY 150 g 2   Estradiol 0.75 MG/1.25 GM (0.06%) topical gel APPLY 1 PUMP EVERY DAY AS DIRECTED 50 g 3   levocetirizine (XYZAL) 5 MG tablet Take 1 tablet by mouth once daily 30 tablet 5   PFIZER COVID-19 VAC BIVALENT injection      progesterone (PROMETRIUM) 100 MG capsule Take 1 capsule (100 mg total) by mouth at bedtime. 90 capsule 3   Risankizumab-rzaa (SKYRIZI) 360 MG/2.4ML SOCT 2.4 mLs (360 mg total) by subcutaneous (via wearable injector) route every 8 weeks. 2.4 mL 4   SUMAtriptan (IMITREX) 100 MG tablet      SUMAtriptan (IMITREX) 50 MG tablet Imitrex 50 mg tablet  Take 1 tablet every 6 hours by oral route as needed for headache.     SYNTHROID 200 MCG tablet Take 1 tablet (200 mcg total) by mouth every morning  on an empty stomach Monday - Saturday 78 tablet 4   SYNTHROID 50 MCG tablet Take 1 tablet (50 mcg total)  by mouth once a week on Sunday morning on an empty stomach. 12 tablet 5   terbinafine (LAMISIL) 250 MG tablet Take 1 tablet (250 mg total) by mouth daily. 60 tablet 0   testosterone (ANDROGEL) 50 MG/5GM (1%) GEL Place 5 g onto the skin daily.     valACYclovir (VALTREX) 1000 MG tablet Take 1 tablet (1,000 mg total) by mouth daily for 3 days 3 tablet 1   valACYclovir (VALTREX) 500 MG tablet Take 1 tablet by mouth 2 times daily for 5 days;  starting 1 day prior to procedure. 10 tablet 0   No current facility-administered medications for this visit.    PHYSICAL EXAMINATION: ECOG PERFORMANCE STATUS: 0 - Asymptomatic  Vitals:   06/26/22 1310  BP: 119/71  Pulse: 74  Resp: 18  Temp: 97.8 F (36.6 C)  SpO2: 100%  Wt Readings from Last 3 Encounters:  06/26/22 149 lb 4.8 oz (67.7 kg)  12/31/21 142 lb 8 oz (64.6 kg)  09/24/21 147 lb 6.4 oz (66.9 kg)      NECK: (-) supple, thyroid normal size, non-tender, without nodularity LYMPH: (-) no palpable lymphadenopathy in the cervical, axillary  LUNGS: (-)clear to auscultation and percussion with normal breathing effort HEART: (-)regular rate & rhythm and (-) no murmurs and no lower extremity edema ABDOMEN:(-) abdomen soft, (-) non-tender and normal bowel sounds    LABORATORY DATA:  I have reviewed the data as listed    Latest Ref Rng & Units 06/26/2022   12:37 PM 12/31/2021    1:56 PM 09/20/2021    1:55 PM  CBC  WBC 4.0 - 10.5 K/uL 9.0  7.1  5.9   Hemoglobin 12.0 - 15.0 g/dL 14.7  14.1  11.4   Hematocrit 36.0 - 46.0 % 42.3  41.9  35.2   Platelets 150 - 400 K/uL 273  314  321         Latest Ref Rng & Units 06/26/2022   12:37 PM 12/31/2021    1:56 PM 09/20/2021    1:55 PM  CMP  Glucose 70 - 99 mg/dL 128  107  139   BUN 6 - 20 mg/dL 15  11  14    Creatinine 0.44 - 1.00 mg/dL 0.78  0.76  0.82   Sodium 135 - 145 mmol/L 140  139  137   Potassium 3.5 - 5.1 mmol/L 3.9  4.0  4.1   Chloride 98 - 111 mmol/L 107  107  105   CO2 22 - 32  mmol/L 28  26  28    Calcium 8.9 - 10.3 mg/dL 9.3  9.1  9.1   Total Protein 6.5 - 8.1 g/dL 7.0  7.3  7.2   Total Bilirubin 0.3 - 1.2 mg/dL 0.6  0.6  0.5   Alkaline Phos 38 - 126 U/L 69  80  93   AST 15 - 41 U/L 19  15  16    ALT 0 - 44 U/L 16  11  13        RADIOGRAPHIC STUDIES: I have personally reviewed the radiological images as listed and agreed with the findings in the report. No results found.    Orders Placed This Encounter  Procedures   CT CHEST ABDOMEN PELVIS W CONTRAST    Standing Status:   Future    Standing Expiration Date:   06/26/2023    Order Specific Question:   If indicated for the ordered procedure, I authorize the administration of contrast media per Radiology protocol    Answer:   Yes    Order Specific Question:   Does the patient have a contrast media/X-ray dye allergy?    Answer:   Yes    Order Specific Question:   Is patient pregnant?    Answer:   No    Order Specific Question:   Preferred imaging location?    Answer:   [facility]    Order Specific Question:   Release to patient    Answer:   Immediate    Order Specific Question:   Is Oral Contrast requested for this exam?    Answer:   Yes, Per Radiology protocol   All questions were answered. The patient knows to call the clinic with any problems, questions or concerns. No barriers to learning was detected. The total time spent in the appointment was 25 minutes.  Truitt Merle, MD 06/26/2022   Felicity Coyer, CMA, am acting as scribe for Truitt Merle, MD.   I have reviewed the above documentation for accuracy and completeness, and I agree with the above.

## 2022-07-04 ENCOUNTER — Other Ambulatory Visit (HOSPITAL_COMMUNITY): Payer: Self-pay

## 2022-07-05 ENCOUNTER — Other Ambulatory Visit (HOSPITAL_COMMUNITY): Payer: Self-pay

## 2022-07-07 ENCOUNTER — Other Ambulatory Visit (HOSPITAL_COMMUNITY): Payer: Self-pay

## 2022-07-08 ENCOUNTER — Other Ambulatory Visit: Payer: Self-pay

## 2022-07-08 ENCOUNTER — Other Ambulatory Visit (HOSPITAL_COMMUNITY): Payer: Self-pay

## 2022-07-09 ENCOUNTER — Other Ambulatory Visit (HOSPITAL_COMMUNITY): Payer: Self-pay

## 2022-07-09 ENCOUNTER — Other Ambulatory Visit: Payer: Self-pay

## 2022-07-10 ENCOUNTER — Other Ambulatory Visit: Payer: Self-pay

## 2022-07-10 ENCOUNTER — Other Ambulatory Visit (HOSPITAL_COMMUNITY): Payer: Self-pay

## 2022-07-10 MED ORDER — VALACYCLOVIR HCL 1 G PO TABS
1000.0000 mg | ORAL_TABLET | Freq: Every day | ORAL | 2 refills | Status: DC
  Filled 2022-07-10: qty 30, 30d supply, fill #0
  Filled 2022-08-05: qty 30, 30d supply, fill #1
  Filled 2022-11-08: qty 30, 30d supply, fill #2

## 2022-07-11 ENCOUNTER — Other Ambulatory Visit: Payer: Self-pay

## 2022-07-13 ENCOUNTER — Other Ambulatory Visit (HOSPITAL_COMMUNITY): Payer: Self-pay

## 2022-07-16 ENCOUNTER — Other Ambulatory Visit: Payer: Self-pay

## 2022-07-16 ENCOUNTER — Other Ambulatory Visit (HOSPITAL_COMMUNITY): Payer: Self-pay

## 2022-07-18 DIAGNOSIS — D2272 Melanocytic nevi of left lower limb, including hip: Secondary | ICD-10-CM | POA: Diagnosis not present

## 2022-07-18 DIAGNOSIS — Z86018 Personal history of other benign neoplasm: Secondary | ICD-10-CM | POA: Diagnosis not present

## 2022-07-18 DIAGNOSIS — L578 Other skin changes due to chronic exposure to nonionizing radiation: Secondary | ICD-10-CM | POA: Diagnosis not present

## 2022-07-18 DIAGNOSIS — D225 Melanocytic nevi of trunk: Secondary | ICD-10-CM | POA: Diagnosis not present

## 2022-07-18 DIAGNOSIS — L821 Other seborrheic keratosis: Secondary | ICD-10-CM | POA: Diagnosis not present

## 2022-07-18 DIAGNOSIS — D2271 Melanocytic nevi of right lower limb, including hip: Secondary | ICD-10-CM | POA: Diagnosis not present

## 2022-07-18 DIAGNOSIS — Z85828 Personal history of other malignant neoplasm of skin: Secondary | ICD-10-CM | POA: Diagnosis not present

## 2022-07-18 DIAGNOSIS — D2262 Melanocytic nevi of left upper limb, including shoulder: Secondary | ICD-10-CM | POA: Diagnosis not present

## 2022-07-18 DIAGNOSIS — L570 Actinic keratosis: Secondary | ICD-10-CM | POA: Diagnosis not present

## 2022-07-19 ENCOUNTER — Other Ambulatory Visit (HOSPITAL_COMMUNITY): Payer: Self-pay

## 2022-07-23 ENCOUNTER — Other Ambulatory Visit: Payer: Self-pay

## 2022-07-23 ENCOUNTER — Other Ambulatory Visit (HOSPITAL_COMMUNITY): Payer: Self-pay

## 2022-07-23 MED ORDER — PROLIA 60 MG/ML ~~LOC~~ SOSY
PREFILLED_SYRINGE | SUBCUTANEOUS | 1 refills | Status: DC
  Filled 2022-08-05: qty 1, fill #0
  Filled 2022-09-27: qty 1, 180d supply, fill #0

## 2022-07-24 ENCOUNTER — Other Ambulatory Visit (HOSPITAL_COMMUNITY): Payer: Self-pay

## 2022-07-30 ENCOUNTER — Other Ambulatory Visit (HOSPITAL_COMMUNITY): Payer: Self-pay

## 2022-07-31 ENCOUNTER — Other Ambulatory Visit (HOSPITAL_COMMUNITY): Payer: Self-pay

## 2022-07-31 MED ORDER — ESCITALOPRAM OXALATE 10 MG PO TABS
10.0000 mg | ORAL_TABLET | Freq: Every day | ORAL | 2 refills | Status: DC
  Filled 2022-07-31: qty 90, 90d supply, fill #0
  Filled 2022-11-08: qty 90, 90d supply, fill #1
  Filled 2022-12-25 – 2023-01-28 (×2): qty 90, 90d supply, fill #2

## 2022-08-01 ENCOUNTER — Other Ambulatory Visit (HOSPITAL_COMMUNITY): Payer: Self-pay

## 2022-08-05 ENCOUNTER — Other Ambulatory Visit: Payer: Self-pay

## 2022-08-05 ENCOUNTER — Other Ambulatory Visit (HOSPITAL_COMMUNITY): Payer: Self-pay

## 2022-08-06 ENCOUNTER — Other Ambulatory Visit (HOSPITAL_COMMUNITY): Payer: Self-pay

## 2022-08-08 ENCOUNTER — Other Ambulatory Visit: Payer: Self-pay

## 2022-08-08 ENCOUNTER — Other Ambulatory Visit (HOSPITAL_COMMUNITY): Payer: Self-pay

## 2022-08-14 ENCOUNTER — Telehealth: Payer: Self-pay | Admitting: *Deleted

## 2022-08-14 ENCOUNTER — Other Ambulatory Visit (INDEPENDENT_AMBULATORY_CARE_PROVIDER_SITE_OTHER): Payer: Commercial Managed Care - PPO | Admitting: Podiatry

## 2022-08-14 DIAGNOSIS — M7660 Achilles tendinitis, unspecified leg: Secondary | ICD-10-CM

## 2022-08-14 MED ORDER — CELECOXIB 200 MG PO CAPS: 200.0000 mg | ORAL_CAPSULE | Freq: Two times a day (BID) | ORAL | 0 refills | Status: AC

## 2022-08-14 MED ORDER — CELECOXIB 200 MG PO CAPS: ORAL_CAPSULE | ORAL | 0 refills | Status: DC

## 2022-08-14 MED ORDER — CELECOXIB 200 MG PO CAPS: 200.0000 mg | ORAL_CAPSULE | Freq: Two times a day (BID) | ORAL | 0 refills | Status: DC

## 2022-08-14 NOTE — Progress Notes (Signed)
Saw the patient in office for achilles tendinitis and to order celebrex.  Jody Chen

## 2022-08-19 NOTE — Telephone Encounter (Signed)
error 

## 2022-08-23 ENCOUNTER — Other Ambulatory Visit (HOSPITAL_COMMUNITY): Payer: Self-pay

## 2022-09-03 DIAGNOSIS — D485 Neoplasm of uncertain behavior of skin: Secondary | ICD-10-CM | POA: Diagnosis not present

## 2022-09-05 ENCOUNTER — Other Ambulatory Visit: Payer: Self-pay

## 2022-09-10 ENCOUNTER — Other Ambulatory Visit (HOSPITAL_COMMUNITY): Payer: Self-pay

## 2022-09-11 ENCOUNTER — Other Ambulatory Visit (HOSPITAL_COMMUNITY): Payer: Self-pay

## 2022-09-16 ENCOUNTER — Encounter (HOSPITAL_COMMUNITY): Payer: Self-pay

## 2022-09-16 ENCOUNTER — Other Ambulatory Visit (HOSPITAL_COMMUNITY): Payer: Self-pay

## 2022-09-17 ENCOUNTER — Other Ambulatory Visit: Payer: Self-pay

## 2022-09-18 ENCOUNTER — Other Ambulatory Visit: Payer: Self-pay

## 2022-09-18 ENCOUNTER — Other Ambulatory Visit (HOSPITAL_COMMUNITY): Payer: Self-pay

## 2022-09-19 ENCOUNTER — Other Ambulatory Visit: Payer: Self-pay

## 2022-09-19 ENCOUNTER — Other Ambulatory Visit (HOSPITAL_COMMUNITY): Payer: Self-pay

## 2022-09-19 MED ORDER — FLUTICASONE PROPIONATE 50 MCG/ACT NA SUSP
1.0000 | Freq: Two times a day (BID) | NASAL | 5 refills | Status: AC
  Filled 2022-09-19: qty 16, 30d supply, fill #0
  Filled 2023-01-28: qty 16, 30d supply, fill #1
  Filled 2023-06-20: qty 16, 30d supply, fill #2
  Filled 2023-09-04 – 2023-09-07 (×2): qty 16, 30d supply, fill #3

## 2022-09-19 MED ORDER — AZELASTINE HCL 137 MCG/SPRAY NA SOLN
1.0000 | Freq: Two times a day (BID) | NASAL | 5 refills | Status: AC
  Filled 2022-09-19: qty 30, 50d supply, fill #0
  Filled 2022-11-08: qty 30, 50d supply, fill #1
  Filled 2022-12-25: qty 30, 50d supply, fill #2

## 2022-09-20 ENCOUNTER — Telehealth: Payer: Self-pay | Admitting: Hematology

## 2022-09-20 ENCOUNTER — Other Ambulatory Visit (HOSPITAL_COMMUNITY): Payer: Self-pay

## 2022-09-20 ENCOUNTER — Other Ambulatory Visit: Payer: Self-pay

## 2022-09-20 MED ORDER — ESTRADIOL 0.75 MG/1.25 GM (0.06%) TD GEL
TRANSDERMAL | 2 refills | Status: DC
  Filled 2022-09-20 – 2022-10-16 (×2): qty 150, 90d supply, fill #0
  Filled 2022-11-08 – 2023-01-20 (×3): qty 150, 30d supply, fill #0
  Filled 2023-01-28: qty 112.5, 90d supply, fill #0
  Filled 2023-02-10: qty 150, 30d supply, fill #0
  Filled 2023-03-12: qty 150, 90d supply, fill #0
  Filled 2023-03-26: qty 150, 30d supply, fill #0

## 2022-09-23 ENCOUNTER — Inpatient Hospital Stay: Payer: Commercial Managed Care - PPO | Attending: Hematology

## 2022-09-25 ENCOUNTER — Other Ambulatory Visit: Payer: Self-pay

## 2022-09-25 ENCOUNTER — Other Ambulatory Visit (HOSPITAL_COMMUNITY): Payer: Self-pay

## 2022-09-26 ENCOUNTER — Other Ambulatory Visit (HOSPITAL_COMMUNITY): Payer: Self-pay

## 2022-09-26 ENCOUNTER — Inpatient Hospital Stay: Payer: Commercial Managed Care - PPO | Admitting: Hematology

## 2022-09-26 ENCOUNTER — Telehealth: Payer: Self-pay | Admitting: Hematology

## 2022-09-26 NOTE — Assessment & Plan Note (Deleted)
on stelara for colitis and recently started on Entocort (10/10/21) -Follow-up with GI Dr. Sharmon Revere at [facility]  -She is now on Skyrizi and her Crohn's disease is better controlled.

## 2022-09-26 NOTE — Assessment & Plan Note (Deleted)
pT3N0M0 stage IIA, MSI-H -Presented with intermittent partial small bowel obstruction. S/p jejunal resection on 10/11/20 for recurrent Crohn's flare, pathology revealed poorly differentiated carcinoma with medullary features, 10 cm, invading muscularis propria, margins and lymph nodes negative. -baseline CEA 10/25/20 WNL -staging PET scan 10/30/20 showed no residual or metastatic disease. -no indication for adjuvant chemotherapy -restaging CT AP on 09/19/21 showing NED, only acute colitis and functional obstruction which are related to her Crohn's disease  

## 2022-09-27 ENCOUNTER — Other Ambulatory Visit: Payer: Self-pay

## 2022-09-27 ENCOUNTER — Other Ambulatory Visit (HOSPITAL_COMMUNITY): Payer: Self-pay

## 2022-09-30 ENCOUNTER — Other Ambulatory Visit (HOSPITAL_COMMUNITY): Payer: Self-pay

## 2022-10-03 ENCOUNTER — Other Ambulatory Visit (HOSPITAL_COMMUNITY): Payer: Self-pay

## 2022-10-04 ENCOUNTER — Other Ambulatory Visit (HOSPITAL_COMMUNITY): Payer: Self-pay

## 2022-10-07 ENCOUNTER — Other Ambulatory Visit: Payer: Self-pay

## 2022-10-07 ENCOUNTER — Encounter (HOSPITAL_COMMUNITY): Payer: Self-pay

## 2022-10-07 ENCOUNTER — Ambulatory Visit (HOSPITAL_COMMUNITY)
Admission: RE | Admit: 2022-10-07 | Discharge: 2022-10-07 | Disposition: A | Discharge status: Home / Self Care | Payer: Commercial Managed Care - PPO | Source: Ambulatory Visit | Attending: Hematology | Admitting: Hematology

## 2022-10-07 ENCOUNTER — Other Ambulatory Visit (HOSPITAL_COMMUNITY): Payer: Self-pay

## 2022-10-07 ENCOUNTER — Inpatient Hospital Stay: Payer: Commercial Managed Care - PPO | Attending: Hematology

## 2022-10-07 DIAGNOSIS — Z85850 Personal history of malignant neoplasm of thyroid: Secondary | ICD-10-CM | POA: Diagnosis not present

## 2022-10-07 DIAGNOSIS — K5090 Crohn's disease, unspecified, without complications: Secondary | ICD-10-CM | POA: Diagnosis not present

## 2022-10-07 DIAGNOSIS — I700 Atherosclerosis of aorta: Secondary | ICD-10-CM | POA: Insufficient documentation

## 2022-10-07 DIAGNOSIS — Z79899 Other long term (current) drug therapy: Secondary | ICD-10-CM | POA: Insufficient documentation

## 2022-10-07 DIAGNOSIS — C179 Malignant neoplasm of small intestine, unspecified: Secondary | ICD-10-CM

## 2022-10-07 DIAGNOSIS — Z85068 Personal history of other malignant neoplasm of small intestine: Secondary | ICD-10-CM | POA: Diagnosis not present

## 2022-10-07 DIAGNOSIS — R160 Hepatomegaly, not elsewhere classified: Secondary | ICD-10-CM | POA: Diagnosis not present

## 2022-10-07 DIAGNOSIS — K50012 Crohn's disease of small intestine with intestinal obstruction: Secondary | ICD-10-CM | POA: Insufficient documentation

## 2022-10-07 DIAGNOSIS — Z7952 Long term (current) use of systemic steroids: Secondary | ICD-10-CM | POA: Insufficient documentation

## 2022-10-07 DIAGNOSIS — Z79890 Hormone replacement therapy (postmenopausal): Secondary | ICD-10-CM | POA: Insufficient documentation

## 2022-10-07 DIAGNOSIS — Z79624 Long term (current) use of inhibitors of nucleotide synthesis: Secondary | ICD-10-CM | POA: Diagnosis not present

## 2022-10-07 DIAGNOSIS — R911 Solitary pulmonary nodule: Secondary | ICD-10-CM | POA: Insufficient documentation

## 2022-10-07 DIAGNOSIS — K529 Noninfective gastroenteritis and colitis, unspecified: Secondary | ICD-10-CM | POA: Diagnosis not present

## 2022-10-07 DIAGNOSIS — D500 Iron deficiency anemia secondary to blood loss (chronic): Secondary | ICD-10-CM

## 2022-10-07 LAB — CMP (CANCER CENTER ONLY)
ALT: 12 U/L (ref 0–44)
AST: 17 U/L (ref 15–41)
Albumin: 3.8 g/dL (ref 3.5–5.0)
Alkaline Phosphatase: 82 U/L (ref 38–126)
Anion gap: 6 (ref 5–15)
BUN: 11 mg/dL (ref 6–20)
CO2: 27 mmol/L (ref 22–32)
Calcium: 9 mg/dL (ref 8.9–10.3)
Chloride: 108 mmol/L (ref 98–111)
Creatinine: 0.79 mg/dL (ref 0.44–1.00)
GFR, Estimated: >60 mL/min (ref >60)
Glucose, Bld: 85 mg/dL (ref 70–99)
Potassium: 4 mmol/L (ref 3.5–5.1)
Sodium: 141 mmol/L (ref 135–145)
Total Bilirubin: 0.6 mg/dL (ref 0.3–1.2)
Total Protein: 7.2 g/dL (ref 6.5–8.1)

## 2022-10-07 LAB — FERRITIN: Ferritin: 20 ng/mL (ref 11–307)

## 2022-10-07 LAB — IRON AND IRON BINDING CAPACITY (CC-WL,HP ONLY)
Iron: 131 ug/dL (ref 28–170)
Saturation Ratios: 38 % — ABNORMAL HIGH (ref 10.4–31.8)
TIBC: 349 ug/dL (ref 250–450)
UIBC: 218 ug/dL (ref 148–442)

## 2022-10-07 LAB — CBC WITH DIFFERENTIAL (CANCER CENTER ONLY)
Abs Immature Granulocytes: 0.02 10*3/uL (ref 0.00–0.07)
Basophils Absolute: 0 10*3/uL (ref 0.0–0.1)
Basophils Relative: 0 %
Eosinophils Absolute: 0.1 10*3/uL (ref 0.0–0.5)
Eosinophils Relative: 1 %
HCT: 42.4 % (ref 36.0–46.0)
Hemoglobin: 14.7 g/dL (ref 12.0–15.0)
Immature Granulocytes: 0 %
Lymphocytes Relative: 16 %
Lymphs Abs: 1.2 10*3/uL (ref 0.7–4.0)
MCH: 33.9 pg (ref 26.0–34.0)
MCHC: 34.7 g/dL (ref 30.0–36.0)
MCV: 97.7 fL (ref 80.0–100.0)
Monocytes Absolute: 0.6 10*3/uL (ref 0.1–1.0)
Monocytes Relative: 7 %
Neutro Abs: 5.5 10*3/uL (ref 1.7–7.7)
Neutrophils Relative %: 76 %
Platelet Count: 316 10*3/uL (ref 150–400)
RBC: 4.34 MIL/uL (ref 3.87–5.11)
RDW: 12.4 % (ref 11.5–15.5)
WBC Count: 7.4 10*3/uL (ref 4.0–10.5)
nRBC: 0 % (ref 0.0–0.2)

## 2022-10-07 LAB — CEA (ACCESS): CEA (CHCC): <1 ng/mL (ref 0.00–5.00)

## 2022-10-07 MED ORDER — IOHEXOL 300 MG/ML  SOLN
100.0000 mL | Freq: Once | INTRAMUSCULAR | Status: AC | PRN
  Administered 2022-10-07: 100 mL via INTRAVENOUS

## 2022-10-07 MED ORDER — SODIUM CHLORIDE (PF) 0.9 % IJ SOLN
INTRAMUSCULAR | Status: AC
  Filled 2022-10-07: qty 50

## 2022-10-08 ENCOUNTER — Other Ambulatory Visit (HOSPITAL_COMMUNITY): Payer: Self-pay

## 2022-10-08 ENCOUNTER — Ambulatory Visit: Payer: Commercial Managed Care - PPO | Attending: Internal Medicine | Admitting: Pharmacist

## 2022-10-08 ENCOUNTER — Other Ambulatory Visit: Payer: Self-pay

## 2022-10-08 ENCOUNTER — Other Ambulatory Visit: Payer: Self-pay | Admitting: Pharmacist

## 2022-10-08 DIAGNOSIS — Z79899 Other long term (current) drug therapy: Secondary | ICD-10-CM

## 2022-10-08 MED ORDER — PROLIA 60 MG/ML ~~LOC~~ SOSY
PREFILLED_SYRINGE | SUBCUTANEOUS | 1 refills | Status: DC
  Filled 2022-10-08: qty 1, fill #0

## 2022-10-08 NOTE — Progress Notes (Signed)
S:  Patient presents for review of their specialty medication therapy.  Patient is currently taking Prolia for osteoporosis. Patient is managed by Dr. Talmage Nap for this.   Adherence: confirms. Has taken 1 injection so far.   Efficacy: no fractures since starting therapy. BMD is typical monitored 1-2 years on Prolia therapy.   Dosing: 60 mg subcutaneous once every 6 months  Dose adjustments: Renal: Monitor patients with severe impairment (CrCl <30 mL/minute or on dialysis) closely, as significant and prolonged hypocalcemia (incidence of 29% and potentially lasting weeks to months) and marked elevations of serum parathyroid hormone are serious risks in this population. Ensure adequate calcium and vitamin D intake/supplementation. CrCl ?30 mL/minute: No dosage adjustment necessary. CrCl <30 mL/minute: No dosage adjustment necessary; use in conjunction with guidance from patient's nephrology team. Hepatic: no dose adjustments (has not been studied)  Drug-drug interactions: none identified   Screening: TB test: completed per pt  Hepatitis: completed per pt   Monitoring: S/sx of infection: none S/sx of hypersensitivity: none S/sx of hypocalcemia/hypercalcemia: none Dermatitis/skin rash: none Peripheral edema: none HA: none GI upset: none  Other side effects: none  Last bone density study: prior to therapy    O:      Lab Results  Component Value Date   WBC 7.4 10/07/2022   HGB 14.7 10/07/2022   HCT 42.4 10/07/2022   MCV 97.7 10/07/2022   PLT 316 10/07/2022      Chemistry      Component Value Date/Time   NA 141 10/07/2022 1139   K 4.0 10/07/2022 1139   CL 108 10/07/2022 1139   CO2 27 10/07/2022 1139   BUN 11 10/07/2022 1139   CREATININE 0.79 10/07/2022 1139   CREATININE 0.69 02/09/2020 1216      Component Value Date/Time   CALCIUM 9.0 10/07/2022 1139   ALKPHOS 82 10/07/2022 1139   AST 17 10/07/2022 1139   ALT 12 10/07/2022 1139   BILITOT 0.6 10/07/2022 1139        A/P: 1. Medication review: Patient currently on Prolia for osteoporosis. Reviewed the medication with the patient, including the following: Prolia (denosumab) is a monoclonal antibody with affinity for nuclear factor-kappa ligand (RANKL). Prolia binds to RANKL and prevents osteoclast formation, leading to decreased bone resorption and increased bone mass in osteoporosis. Patient educated on purpose, proper use, and potential adverse effects of Prolia. The most common adverse effects are hypersensitivities, peripheral edema, dermatitis/skin rash, GI upset, HA, joint pain, and infection. There is the possibility of atypical femur fracture, serum calcium disturbances, and osteonecrosis of the jaw. Patients should monitor for and report hip, thigh, or groin pain. Additionally, patients should monitor for and report jaw pain, tooth/periodontal infection, toothache, and/or gingival ulceration/erosion. Prolia exists as a solution prefilled syringe for SQ administration. Administration: Denosumab is intended for SubQ route only and should not be administered IV, IM, or intradermally. Prior to administration, bring to room temperature in original container (allow to stand ~15 to 30 minutes); do not warm by any other method. Solution may contain trace amounts of translucent to white protein particles; do not use if cloudy, discolored (normal solution should be clear and colorless to pale yellow), or contains excessive particles or foreign matter. Avoid vigorous shaking. Administer via SubQ injection in the upper arm, upper thigh, or abdomen; should only be administered by a health care professional. No recommendations for any changes at this time.   Butch Penny, PharmD, P[place]y [place], CPP Clinical Pharmacist [facility] [phone]

## 2022-10-10 NOTE — Assessment & Plan Note (Signed)
pT3N0M0 stage IIA, MSI-H -Presented with intermittent partial small bowel obstruction. S/p jejunal resection on 10/11/20 for recurrent Crohn's flare, pathology revealed poorly differentiated carcinoma with medullary features, 10 cm, invading muscularis propria, margins and lymph nodes negative. -baseline CEA 10/25/20 WNL -staging PET scan 10/30/20 showed no residual or metastatic disease. -no indication for adjuvant chemotherapy -restaging CT CAP on 10/07/2022 showing NED, left side colitis and stable 4mm lung nodule likely benign  -she is clinically doing well, no concern for recurrence.  She is 2 years out from her initial diagnosis, risk of recurrence has decreased.

## 2022-10-10 NOTE — Assessment & Plan Note (Signed)
on stelara for colitis and recently started on Entocort (10/10/21) -Follow-up with GI Dr. B[place]field at [facility]  -She is now on Skyrizi and her Crohn's disease is better controlled.   

## 2022-10-11 ENCOUNTER — Inpatient Hospital Stay (HOSPITAL_BASED_OUTPATIENT_CLINIC_OR_DEPARTMENT_OTHER): Payer: Commercial Managed Care - PPO | Admitting: Hematology

## 2022-10-11 ENCOUNTER — Other Ambulatory Visit (HOSPITAL_COMMUNITY): Payer: Self-pay

## 2022-10-11 ENCOUNTER — Other Ambulatory Visit: Payer: Self-pay

## 2022-10-11 ENCOUNTER — Encounter: Payer: Self-pay | Admitting: Hematology

## 2022-10-11 DIAGNOSIS — K5000 Crohn's disease of small intestine without complications: Secondary | ICD-10-CM | POA: Diagnosis not present

## 2022-10-11 DIAGNOSIS — C179 Malignant neoplasm of small intestine, unspecified: Secondary | ICD-10-CM | POA: Diagnosis not present

## 2022-10-11 MED ORDER — SKYRIZI 360 MG/2.4ML ~~LOC~~ SOCT
SUBCUTANEOUS | 4 refills | Status: DC
  Filled 2022-10-15: qty 2.4, fill #0
  Filled 2022-11-01: qty 2.4, 56d supply, fill #0
  Filled 2023-01-08: qty 2.4, fill #0

## 2022-10-11 NOTE — Progress Notes (Signed)
[facility]   [phone] [phone]   Clinic Follow up Note   Patient Care Team: Pcp, No as PCP - General Jody Mood, MD as Consulting Physician (Oncology) Jody Lente, RN as Oncology Nurse Navigator (Oncology) Jody Frames, MD as Consulting Physician (Endocrinology) Jody Hales, MD as Consulting Physician (Internal Medicine) Jody Michaelis, MD as Consulting Physician (Colon and Rectal Surgery) Jody Overlie, MD as Consulting Physician (Obstetrics and Gynecology)  Date of Service:  10/11/2022  I connected with Jody Chen on 10/11/2022 at 10:20 AM EDT by telephone visit and verified that I am speaking with the correct person using two identifiers.  I discussed the limitations, risks, security and privacy concerns of performing an evaluation and management service by telephone and the availability of in person appointments. I also discussed with the patient that there may be a patient responsible charge related to this service. The patient expressed understanding and agreed to proceed.   Other persons participating in the visit and their role in the encounter:  NO  Patient's location:   [place]  Provider's location:  [facility]  CHIEF COMPLAINT: f/u of  small bowel cancer   CURRENT THERAPY:  Surveillance  ASSESSMENT & PLAN:  Jody Chen is a 62 y.o. 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 S/p jejunal resection on 10/11/20 for recurrent Crohn's flare, pathology revealed poorly differentiated carcinoma with medullary features, 10 cm, invading muscularis propria, margins and lymph nodes negative. -baseline CEA 10/25/20 WNL -staging PET scan 10/30/20 showed no residual or metastatic disease. -no indication for adjuvant chemotherapy -restaging CT CAP on 10/07/2022 showing NED, left side colitis and stable 4mm lung nodule likely benign  -she is clinically  doing well, no concern for recurrence.  She is 2 years out from her initial diagnosis, risk of recurrence has decreased.    Crohn's disease of ileum on stelara for colitis and recently started on Entocort (10/10/21) -Follow-up with GI Dr. Sharmon Chen at [facility]  -She is now on Skyrizi and her Crohn's disease is better controlled.   PLAN: -Reviewed CT scan w/ pt, NED -Lab reviewed, Normal -Tumor marker-normal. -repeat CT scan in a 1 year  -lab and f/u in 6 months  SUMMARY OF ONCOLOGIC HISTORY: Oncology History Overview Note   Cancer Staging  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HCC) Staging form: Small Intestine - Adenocarcinoma, AJCC 8th Edition - Pathologic stage from 10/11/2020: Stage IIA (pT3, pN0, cM0) - Signed by Jody Mood, MD on 10/25/2020 Total positive nodes: 0 Total nodes examined: 12 Histologic grade (G): G3 Histologic grading system: 4 grade system Residual tumor (R): R0 - None Tumor location in duodenum: Jejunum Microsatellite instability (MSI): Unstable high     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HCC)  10/11/2020 Cancer Staging   Staging form: Small Intestine - Adenocarcinoma, AJCC 8th Edition - Pathologic stage from 10/11/2020: Stage IIA (pT3, pN0, cM0) - Signed by Jody Mood, MD on 10/25/2020 Total positive nodes: 0 Total nodes examined: 12 Histologic grade (G): G3 Histologic grading system: 4 grade system Residual tumor (R): R0 - None Tumor location in duodenum: Jejunum Microsatellite instability (MSI): Unstable high   10/24/2020 Initial Diagnosis   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HCC)   10/07/2022 Imaging    IMPRESSION: 1. No evidence of recurrent or metastatic disease within the chest, abdomen, or pelvis. 2. Left-sided colitis which is similar to 09/19/2021 and presumably related to Crohn disease. 3. Left upper lobe 4 mm  pulmonary nodule is unchanged compared to 2022 and considered benign. 4. Hepatomegaly 5.  Aortic Atherosclerosis (ICD10-I70.0).          INTERVAL HISTORY:  Jody Chen was  contacted for a follow up of  small bowel cancer . She was last seen by me on 06/26/2022.     All other systems were reviewed with the patient and are negative.  MEDICAL HISTORY:  Past Medical History:  Diagnosis Date   Abdominal distention    Abdominal pain    Cancer (HCC)    thyroid   Constipation    Crohn's    Migraines    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   Thyroid disease     SURGICAL HISTORY: Past Surgical History:  Procedure Laterality Date   BREAST SURGERY     COLON SURGERY     segmental jejunal small bowe resection      THYROIDECTOMY, PARTIAL  11/06/1992    I have reviewed the social history and family history with the patient and they are unchanged from previous note.  ALLERGIES:  is allergic to butorphanol and sulfa antibiotics.  MEDICATIONS:  Current Outpatient Medications  Medication Sig Dispense Refill   acyclovir (ZOVIRAX) 200 MG capsule Take 3 capsules (600 mg total) by mouth now then 1 capsule 5 times a day 38 capsule 2   ALPRAZolam (XANAX) 0.5 MG tablet Take 1 tablet (0.5 mg total) by mouth daily as needed for anxiety 60 tablet 3   alprazolam (XANAX) 2 MG tablet alprazolam 2 mg tablet  TAKE 1/2 TABLET ORALLY EVERY 8 HOURS AS NEEDED FOR ANXIETY     Azelastine HCl 137 MCG/SPRAY SOLN Place 1 spray into both nostrils 2 (two) times daily. 30 mL 5   Azelastine-Fluticasone (DYMISTA) 137-50 MCG/ACT SUSP Use 1 spray in each nostril twice a day 23 g 5   azithromycin (ZITHROMAX Z-PAK) 250 MG tablet Use as directed 6 tablet 0   budesonide (ENTOCORT EC) 3 MG 24 hr capsule Take 3 capsules (9 mg total) by mouth daily. 90 capsule 0   cholestyramine (QUESTRAN) 4 g packet Take 1 packet (4 g total) by mouth daily. 30 packet 5   denosumab (PROLIA) 60 MG/ML SOSY injection 60mg  Subcutaneous once every 6 months 180 days 1 mL 1   escitalopram (LEXAPRO) 10 MG tablet Take 1 tablet (10 mg total) by mouth daily. 90 tablet 2   Estradiol (DIVIGEL) 0.25 MG/0.25GM GEL Apply 1 packet  topically daily as directed 90 each 2   Estradiol (ESTROGEL) 0.75 MG/1.25 GM (0.06%) topical gel APPLY 1 PUMP TOPICALLY EVERY DAY 150 g 2   Estradiol 0.75 MG/1.25 GM (0.06%) topical gel APPLY 1 PUMP EVERY DAY AS DIRECTED 50 g 3   Estradiol 0.75 MG/1.25 GM (0.06%) topical gel Apply 1 pump daily 150 g 2   fluticasone (FLONASE) 50 MCG/ACT nasal spray Place 1 spray into both nostrils 2 (two) times daily. 16 g 5   levocetirizine (XYZAL) 5 MG tablet Take 1 tablet by mouth once daily 30 tablet 5   nitroGLYCERIN (NITRODUR - DOSED IN MG/24 HR) 0.4 mg/hr patch Place 1 patch (0.4 mg total) onto the skin daily. 30 patch 12   PFIZER COVID-19 VAC BIVALENT injection      progesterone (PROMETRIUM) 100 MG capsule Take 1 capsule (100 mg total) by mouth at bedtime. 90 capsule 3   Risankizumab-rzaa (SKYRIZI) 360 MG/2.4ML SOCT 2.4 mLs (360 mg total) by subcutaneous (via wearable injector) route every 8 weeks. 2.4 mL 4  Risankizumab-rzaa (SKYRIZI) 360 MG/2.4ML SOCT Inject 360 mg under the skin every 8 (eight) weeks. 2.4 mL 4   SUMAtriptan (IMITREX) 100 MG tablet      SUMAtriptan (IMITREX) 50 MG tablet Imitrex 50 mg tablet  Take 1 tablet every 6 hours by oral route as needed for headache.     SYNTHROID 200 MCG tablet Take 1 tablet (200 mcg total) by mouth every morning  on an empty stomach Monday - Saturday 78 tablet 4   SYNTHROID 50 MCG tablet Take 1 tablet (50 mcg total) by mouth once a week on Sunday morning on an empty stomach. 12 tablet 5   terbinafine (LAMISIL) 250 MG tablet Take 1 tablet (250 mg total) by mouth daily. 60 tablet 0   testosterone (ANDROGEL) 50 MG/5GM (1%) GEL Place 5 g onto the skin daily.     valACYclovir (VALTREX) 1000 MG tablet Take 1 tablet (1,000 mg total) by mouth daily for 3 days as needed. 30 tablet 2   valACYclovir (VALTREX) 500 MG tablet Take 1 tablet by mouth 2 times daily for 5 days;  starting 1 day prior to procedure. 10 tablet 0   No current facility-administered medications for  this visit.    PHYSICAL EXAMINATION: ECOG PERFORMANCE STATUS: 0 - Asymptomatic  There were no vitals filed for this visit. Wt Readings from Last 3 Encounters:  06/26/22 149 lb 4.8 oz (67.7 kg)  12/31/21 142 lb 8 oz (64.6 kg)  09/24/21 147 lb 6.4 oz (66.9 kg)     No vitals taken today, Exam not performed today  LABORATORY DATA:  I have reviewed the data as listed    Latest Ref Rng & Units 10/07/2022   11:39 AM 06/26/2022   12:37 PM 12/31/2021    1:56 PM  CBC  WBC 4.0 - 10.5 K/uL 7.4  9.0  7.1   Hemoglobin 12.0 - 15.0 g/dL 16.1  09.6  04.5   Hematocrit 36.0 - 46.0 % 42.4  42.3  41.9   Platelets 150 - 400 K/uL 316  273  314         Latest Ref Rng & Units 10/07/2022   11:39 AM 06/26/2022   12:37 PM 12/31/2021    1:56 PM  CMP  Glucose 70 - 99 mg/dL 85  409  811   BUN 6 - 20 mg/dL 11  15  11    Creatinine 0.44 - 1.00 mg/dL 9.14  7.82  9.56   Sodium 135 - 145 mmol/L 141  140  139   Potassium 3.5 - 5.1 mmol/L 4.0  3.9  4.0   Chloride 98 - 111 mmol/L 108  107  107   CO2 22 - 32 mmol/L 27  28  26    Calcium 8.9 - 10.3 mg/dL 9.0  9.3  9.1   Total Protein 6.5 - 8.1 g/dL 7.2  7.0  7.3   Total Bilirubin 0.3 - 1.2 mg/dL 0.6  0.6  0.6   Alkaline Phos 38 - 126 U/L 82  69  80   AST 15 - 41 U/L 17  19  15    ALT 0 - 44 U/L 12  16  11        RADIOGRAPHIC STUDIES: I have personally reviewed the radiological images as listed and agreed with the findings in the report. No results found.    No orders of the defined types were placed in this encounter.  All questions were answered. The patient knows to call the clinic with any problems, questions  or concerns. No barriers to learning was detected. The total time spent in the appointment was 15 minutes.     Jody Mood, MD 10/11/2022   Carolin Coy am acting as scribe for Jody Mood, MD.   I have reviewed the above documentation for accuracy and completeness, and I agree with the above.

## 2022-10-15 ENCOUNTER — Other Ambulatory Visit: Payer: Self-pay

## 2022-10-15 ENCOUNTER — Other Ambulatory Visit (HOSPITAL_COMMUNITY): Payer: Self-pay

## 2022-10-16 ENCOUNTER — Other Ambulatory Visit: Payer: Self-pay

## 2022-10-16 ENCOUNTER — Other Ambulatory Visit (HOSPITAL_COMMUNITY): Payer: Self-pay

## 2022-10-17 ENCOUNTER — Other Ambulatory Visit (HOSPITAL_COMMUNITY): Payer: Self-pay

## 2022-11-01 ENCOUNTER — Other Ambulatory Visit: Payer: Self-pay

## 2022-11-07 ENCOUNTER — Other Ambulatory Visit (HOSPITAL_COMMUNITY): Payer: Self-pay

## 2022-11-07 DIAGNOSIS — M810 Age-related osteoporosis without current pathological fracture: Secondary | ICD-10-CM | POA: Diagnosis not present

## 2022-11-08 ENCOUNTER — Other Ambulatory Visit (HOSPITAL_COMMUNITY): Payer: Self-pay

## 2022-11-08 ENCOUNTER — Other Ambulatory Visit: Payer: Self-pay

## 2022-11-15 ENCOUNTER — Other Ambulatory Visit: Payer: Self-pay

## 2022-11-15 ENCOUNTER — Other Ambulatory Visit (HOSPITAL_COMMUNITY): Payer: Self-pay

## 2022-11-19 DIAGNOSIS — K50012 Crohn's disease of small intestine with intestinal obstruction: Secondary | ICD-10-CM | POA: Diagnosis not present

## 2022-12-10 ENCOUNTER — Other Ambulatory Visit: Payer: Self-pay

## 2022-12-10 ENCOUNTER — Other Ambulatory Visit (HOSPITAL_COMMUNITY): Payer: Self-pay

## 2022-12-10 DIAGNOSIS — K5090 Crohn's disease, unspecified, without complications: Secondary | ICD-10-CM | POA: Diagnosis not present

## 2022-12-10 DIAGNOSIS — E890 Postprocedural hypothyroidism: Secondary | ICD-10-CM | POA: Diagnosis not present

## 2022-12-10 DIAGNOSIS — M810 Age-related osteoporosis without current pathological fracture: Secondary | ICD-10-CM | POA: Diagnosis not present

## 2022-12-10 DIAGNOSIS — C73 Malignant neoplasm of thyroid gland: Secondary | ICD-10-CM | POA: Diagnosis not present

## 2022-12-10 DIAGNOSIS — D509 Iron deficiency anemia, unspecified: Secondary | ICD-10-CM | POA: Diagnosis not present

## 2022-12-10 DIAGNOSIS — L659 Nonscarring hair loss, unspecified: Secondary | ICD-10-CM | POA: Diagnosis not present

## 2022-12-10 DIAGNOSIS — K909 Intestinal malabsorption, unspecified: Secondary | ICD-10-CM | POA: Diagnosis not present

## 2022-12-11 ENCOUNTER — Other Ambulatory Visit (HOSPITAL_COMMUNITY): Payer: Self-pay

## 2022-12-11 ENCOUNTER — Other Ambulatory Visit (HOSPITAL_COMMUNITY): Payer: Self-pay | Admitting: Endocrinology

## 2022-12-11 DIAGNOSIS — I700 Atherosclerosis of aorta: Secondary | ICD-10-CM

## 2022-12-11 MED ORDER — SYNTHROID 200 MCG PO TABS
200.0000 ug | ORAL_TABLET | Freq: Every morning | ORAL | 3 refills | Status: AC
  Filled 2023-01-28 – 2023-03-12 (×2): qty 30, 30d supply, fill #0
  Filled 2023-05-05: qty 90, 90d supply, fill #0
  Filled 2023-08-04: qty 30, 30d supply, fill #1
  Filled 2023-09-04 – 2023-09-07 (×2): qty 90, 90d supply, fill #1
  Filled 2023-10-08: qty 30, 30d supply, fill #1

## 2022-12-16 ENCOUNTER — Ambulatory Visit (HOSPITAL_COMMUNITY)
Admission: RE | Admit: 2022-12-16 | Discharge: 2022-12-16 | Disposition: A | Discharge status: Home / Self Care | Payer: Commercial Managed Care - PPO | Source: Ambulatory Visit | Attending: Endocrinology | Admitting: Endocrinology

## 2022-12-16 DIAGNOSIS — I700 Atherosclerosis of aorta: Secondary | ICD-10-CM | POA: Insufficient documentation

## 2022-12-20 ENCOUNTER — Other Ambulatory Visit (HOSPITAL_COMMUNITY): Payer: Self-pay

## 2022-12-25 ENCOUNTER — Telehealth: Payer: Self-pay

## 2022-12-25 ENCOUNTER — Other Ambulatory Visit (HOSPITAL_COMMUNITY): Payer: Self-pay

## 2022-12-25 NOTE — Telephone Encounter (Signed)
Left message for pt to call back  °

## 2022-12-26 ENCOUNTER — Other Ambulatory Visit: Payer: Self-pay

## 2022-12-27 ENCOUNTER — Other Ambulatory Visit (HOSPITAL_COMMUNITY): Payer: Self-pay

## 2022-12-27 NOTE — Telephone Encounter (Signed)
Spoke with pt regarding new pt appointment. She recently had a calcium score and would like to establish with cardiololgy.

## 2022-12-28 ENCOUNTER — Other Ambulatory Visit: Payer: Self-pay

## 2022-12-28 ENCOUNTER — Encounter (HOSPITAL_COMMUNITY): Payer: Self-pay

## 2022-12-31 ENCOUNTER — Other Ambulatory Visit: Payer: Self-pay

## 2023-01-01 DIAGNOSIS — B078 Other viral warts: Secondary | ICD-10-CM | POA: Diagnosis not present

## 2023-01-01 DIAGNOSIS — L570 Actinic keratosis: Secondary | ICD-10-CM | POA: Diagnosis not present

## 2023-01-01 DIAGNOSIS — D485 Neoplasm of uncertain behavior of skin: Secondary | ICD-10-CM | POA: Diagnosis not present

## 2023-01-02 ENCOUNTER — Other Ambulatory Visit: Payer: Self-pay

## 2023-01-08 ENCOUNTER — Other Ambulatory Visit: Payer: Self-pay

## 2023-01-17 ENCOUNTER — Other Ambulatory Visit: Payer: Self-pay

## 2023-01-17 ENCOUNTER — Other Ambulatory Visit (HOSPITAL_COMMUNITY): Payer: Self-pay

## 2023-01-17 MED ORDER — FLUOROURACIL 5 % EX CREA
1.0000 | TOPICAL_CREAM | Freq: Two times a day (BID) | CUTANEOUS | 0 refills | Status: AC
  Filled 2023-01-17 – 2023-01-20 (×2): qty 40, 30d supply, fill #0

## 2023-01-18 ENCOUNTER — Other Ambulatory Visit (HOSPITAL_COMMUNITY): Payer: Self-pay

## 2023-01-20 ENCOUNTER — Other Ambulatory Visit: Payer: Self-pay

## 2023-01-20 ENCOUNTER — Other Ambulatory Visit (HOSPITAL_COMMUNITY): Payer: Self-pay

## 2023-01-20 NOTE — Progress Notes (Signed)
Specialty Pharmacy Refill Coordination Note  Jody Chen is a 62 y.o. female contacted today regarding refills of specialty medication(s) Risankizumab-Rzaa (Inflammatory Bowel Agents)   Patient requested Pickup at [facility] date: 01/21/23   Medication will be filled on 01/21/23.

## 2023-01-21 ENCOUNTER — Other Ambulatory Visit: Payer: Self-pay

## 2023-01-21 ENCOUNTER — Other Ambulatory Visit (HOSPITAL_COMMUNITY): Payer: Self-pay

## 2023-01-21 DIAGNOSIS — E890 Postprocedural hypothyroidism: Secondary | ICD-10-CM | POA: Diagnosis not present

## 2023-01-21 DIAGNOSIS — I700 Atherosclerosis of aorta: Secondary | ICD-10-CM | POA: Diagnosis not present

## 2023-01-22 ENCOUNTER — Other Ambulatory Visit (HOSPITAL_COMMUNITY): Payer: Self-pay

## 2023-01-27 ENCOUNTER — Other Ambulatory Visit (HOSPITAL_COMMUNITY): Payer: Self-pay

## 2023-01-28 ENCOUNTER — Other Ambulatory Visit: Payer: Self-pay

## 2023-01-30 DIAGNOSIS — Z0110 Encounter for examination of ears and hearing without abnormal findings: Secondary | ICD-10-CM | POA: Diagnosis not present

## 2023-01-30 DIAGNOSIS — H6121 Impacted cerumen, right ear: Secondary | ICD-10-CM | POA: Diagnosis not present

## 2023-02-04 ENCOUNTER — Ambulatory Visit: Payer: Commercial Managed Care - PPO | Admitting: Internal Medicine

## 2023-02-06 DIAGNOSIS — Z86018 Personal history of other benign neoplasm: Secondary | ICD-10-CM | POA: Diagnosis not present

## 2023-02-06 DIAGNOSIS — L821 Other seborrheic keratosis: Secondary | ICD-10-CM | POA: Diagnosis not present

## 2023-02-06 DIAGNOSIS — D2272 Melanocytic nevi of left lower limb, including hip: Secondary | ICD-10-CM | POA: Diagnosis not present

## 2023-02-06 DIAGNOSIS — L7211 Pilar cyst: Secondary | ICD-10-CM | POA: Diagnosis not present

## 2023-02-06 DIAGNOSIS — D2262 Melanocytic nevi of left upper limb, including shoulder: Secondary | ICD-10-CM | POA: Diagnosis not present

## 2023-02-06 DIAGNOSIS — Z85828 Personal history of other malignant neoplasm of skin: Secondary | ICD-10-CM | POA: Diagnosis not present

## 2023-02-06 DIAGNOSIS — L570 Actinic keratosis: Secondary | ICD-10-CM | POA: Diagnosis not present

## 2023-02-06 DIAGNOSIS — D2271 Melanocytic nevi of right lower limb, including hip: Secondary | ICD-10-CM | POA: Diagnosis not present

## 2023-02-06 DIAGNOSIS — D225 Melanocytic nevi of trunk: Secondary | ICD-10-CM | POA: Diagnosis not present

## 2023-02-06 DIAGNOSIS — L578 Other skin changes due to chronic exposure to nonionizing radiation: Secondary | ICD-10-CM | POA: Diagnosis not present

## 2023-02-09 ENCOUNTER — Other Ambulatory Visit (HOSPITAL_COMMUNITY): Payer: Self-pay

## 2023-02-10 ENCOUNTER — Other Ambulatory Visit: Payer: Self-pay

## 2023-02-10 ENCOUNTER — Other Ambulatory Visit (HOSPITAL_COMMUNITY): Payer: Self-pay

## 2023-02-10 MED ORDER — LEVOTHYROXINE SODIUM 200 MCG PO TABS
200.0000 ug | ORAL_TABLET | Freq: Every morning | ORAL | 11 refills | Status: DC
Start: 2023-02-10 — End: 2023-08-06
  Filled 2023-02-10: qty 90, 90d supply, fill #0
  Filled 2023-07-15: qty 90, 90d supply, fill #1

## 2023-02-11 ENCOUNTER — Other Ambulatory Visit: Payer: Self-pay

## 2023-02-11 ENCOUNTER — Other Ambulatory Visit (HOSPITAL_COMMUNITY): Payer: Self-pay

## 2023-02-11 ENCOUNTER — Encounter (HOSPITAL_COMMUNITY): Payer: Self-pay

## 2023-02-11 DIAGNOSIS — H0011 Chalazion right upper eyelid: Secondary | ICD-10-CM | POA: Diagnosis not present

## 2023-02-11 MED ORDER — ALPRAZOLAM 0.5 MG PO TABS
0.5000 mg | ORAL_TABLET | Freq: Every day | ORAL | 0 refills | Status: DC | PRN
  Filled 2023-02-11 (×2): qty 60, 60d supply, fill #0

## 2023-02-11 MED ORDER — ESTRADIOL 0.25 MG/0.25GM TD GEL
0.2500 mg | Freq: Every day | TRANSDERMAL | 0 refills | Status: DC
  Filled 2023-02-11: qty 90, 90d supply, fill #0
  Filled 2023-03-26: qty 30, 30d supply, fill #0
  Filled 2023-05-20: qty 30, 30d supply, fill #1
  Filled 2023-07-15: qty 30, 30d supply, fill #2

## 2023-02-11 MED ORDER — NEOMYCIN-POLYMYXIN-DEXAMETH 0.1 % OP SUSP
1.0000 [drp] | Freq: Four times a day (QID) | OPHTHALMIC | 1 refills | Status: DC
  Filled 2023-02-11: qty 5, 7d supply, fill #0
  Filled 2023-07-15: qty 5, 7d supply, fill #1

## 2023-02-11 MED ORDER — NEOMYCIN-POLYMYXIN-DEXAMETH 0.1 % OP OINT
1.0000 | TOPICAL_OINTMENT | Freq: Two times a day (BID) | OPHTHALMIC | 1 refills | Status: DC
  Filled 2023-02-11 – 2023-02-14 (×2): qty 3.5, 7d supply, fill #0

## 2023-02-12 DIAGNOSIS — Z01118 Encounter for examination of ears and hearing with other abnormal findings: Secondary | ICD-10-CM | POA: Diagnosis not present

## 2023-02-12 DIAGNOSIS — H903 Sensorineural hearing loss, bilateral: Secondary | ICD-10-CM | POA: Diagnosis not present

## 2023-02-14 ENCOUNTER — Other Ambulatory Visit: Payer: Self-pay

## 2023-02-14 ENCOUNTER — Other Ambulatory Visit (HOSPITAL_COMMUNITY): Payer: Self-pay

## 2023-02-14 ENCOUNTER — Encounter: Payer: Self-pay | Admitting: Pharmacist

## 2023-02-17 ENCOUNTER — Other Ambulatory Visit (HOSPITAL_COMMUNITY): Payer: Self-pay

## 2023-02-19 ENCOUNTER — Other Ambulatory Visit (HOSPITAL_COMMUNITY): Payer: Self-pay

## 2023-03-05 ENCOUNTER — Other Ambulatory Visit: Payer: Self-pay | Admitting: Pharmacist

## 2023-03-05 ENCOUNTER — Encounter (HOSPITAL_COMMUNITY): Payer: Self-pay

## 2023-03-05 ENCOUNTER — Other Ambulatory Visit: Payer: Self-pay

## 2023-03-05 ENCOUNTER — Other Ambulatory Visit (HOSPITAL_COMMUNITY): Payer: Self-pay | Admitting: Pharmacy Technician

## 2023-03-05 ENCOUNTER — Other Ambulatory Visit (HOSPITAL_COMMUNITY): Payer: Self-pay

## 2023-03-05 MED ORDER — SKYRIZI 360 MG/2.4ML ~~LOC~~ SOCT
SUBCUTANEOUS | 4 refills | Status: DC
  Filled 2023-03-05: qty 2.4, 56d supply, fill #0
  Filled 2023-04-29: qty 2.4, 56d supply, fill #1
  Filled 2023-06-20: qty 2.4, 56d supply, fill #2
  Filled 2023-08-08: qty 2.4, 56d supply, fill #3
  Filled 2023-10-16: qty 2.4, 56d supply, fill #4
  Filled 2023-10-17: qty 2.4, 30d supply, fill #4
  Filled 2023-10-21: qty 2.4, 56d supply, fill #4
  Filled 2023-10-21: qty 2.4, 30d supply, fill #4

## 2023-03-05 NOTE — Progress Notes (Signed)
Specialty Pharmacy Refill Coordination Note  Jody Chen is a 62 y.o. female contacted today regarding refills of specialty medication(s) Risankizumab-Rzaa (Inflammatory Bowel Agents)   Patient requested Delivery   Delivery date: 03/13/23   Verified address: [address] [place]   Medication will be filled on 03/12/23.  Sent to [facility] for [employer].

## 2023-03-05 NOTE — Progress Notes (Signed)
Please see OV from 04/2022 for complete documentation.   Butch Penny, PharmD, Patsy [place], CPP Clinical Pharmacist [facility] [phone]

## 2023-03-12 ENCOUNTER — Other Ambulatory Visit (HOSPITAL_COMMUNITY): Payer: Self-pay

## 2023-03-12 ENCOUNTER — Other Ambulatory Visit: Payer: Self-pay

## 2023-03-12 MED ORDER — VALACYCLOVIR HCL 1 G PO TABS
1000.0000 mg | ORAL_TABLET | Freq: Every day | ORAL | 0 refills | Status: AC
  Filled 2023-03-12: qty 30, 30d supply, fill #0

## 2023-03-26 ENCOUNTER — Other Ambulatory Visit: Payer: Self-pay

## 2023-03-26 ENCOUNTER — Other Ambulatory Visit (HOSPITAL_COMMUNITY): Payer: Self-pay

## 2023-03-26 DIAGNOSIS — Z974 Presence of external hearing-aid: Secondary | ICD-10-CM | POA: Diagnosis not present

## 2023-03-26 DIAGNOSIS — Z461 Encounter for fitting and adjustment of hearing aid: Secondary | ICD-10-CM | POA: Diagnosis not present

## 2023-03-26 DIAGNOSIS — H903 Sensorineural hearing loss, bilateral: Secondary | ICD-10-CM | POA: Diagnosis not present

## 2023-03-27 ENCOUNTER — Other Ambulatory Visit (HOSPITAL_COMMUNITY): Payer: Self-pay

## 2023-03-28 ENCOUNTER — Other Ambulatory Visit: Payer: Self-pay

## 2023-03-28 ENCOUNTER — Other Ambulatory Visit (HOSPITAL_COMMUNITY): Payer: Self-pay

## 2023-04-03 ENCOUNTER — Other Ambulatory Visit (HOSPITAL_COMMUNITY): Payer: Self-pay

## 2023-04-03 MED ORDER — HYDROCODONE-ACETAMINOPHEN 7.5-325 MG PO TABS
1.0000 | ORAL_TABLET | Freq: Four times a day (QID) | ORAL | 0 refills | Status: DC | PRN
  Filled 2023-04-03: qty 28, 7d supply, fill #0

## 2023-04-03 MED ORDER — ONDANSETRON 4 MG PO TBDP
ORAL_TABLET | ORAL | 0 refills | Status: DC
  Filled 2023-04-03: qty 8, 2d supply, fill #0

## 2023-04-04 ENCOUNTER — Other Ambulatory Visit (HOSPITAL_COMMUNITY): Payer: Self-pay

## 2023-04-14 DIAGNOSIS — Z01812 Encounter for preprocedural laboratory examination: Secondary | ICD-10-CM | POA: Diagnosis not present

## 2023-04-28 ENCOUNTER — Other Ambulatory Visit (HOSPITAL_COMMUNITY): Payer: Self-pay

## 2023-04-28 ENCOUNTER — Other Ambulatory Visit: Payer: Self-pay

## 2023-04-28 MED ORDER — PROLIA 60 MG/ML ~~LOC~~ SOSY
PREFILLED_SYRINGE | SUBCUTANEOUS | 1 refills | Status: AC
  Filled 2023-04-29: qty 1, 30d supply, fill #0
  Filled 2023-10-17: qty 1, 30d supply, fill #1

## 2023-04-29 ENCOUNTER — Ambulatory Visit: Payer: Commercial Managed Care - PPO | Admitting: Internal Medicine

## 2023-04-29 ENCOUNTER — Other Ambulatory Visit: Payer: Self-pay

## 2023-04-29 NOTE — Progress Notes (Signed)
Specialty Pharmacy Refill Coordination Note  Jody Chen is a 63 y.o. female contacted today regarding refills of specialty medication(s) Denosumab (Prolia)   Patient requested Delivery   Delivery date: 05/08/23   Verified address: [facility] [address]   Medication will be filled on 01.29.25.

## 2023-04-29 NOTE — Progress Notes (Signed)
Specialty Pharmacy Refill Coordination Note  Jody Chen is a 64 y.o. female contacted today regarding refills of specialty medication(s) [name]   Patient requested Delivery   Delivery date: 05/08/23   Verified address: [address] [place]   Medication will be filled on 01.29.25.

## 2023-04-30 ENCOUNTER — Ambulatory Visit: Payer: Commercial Managed Care - PPO | Attending: Internal Medicine | Admitting: Pharmacist

## 2023-04-30 ENCOUNTER — Other Ambulatory Visit: Payer: Self-pay

## 2023-04-30 DIAGNOSIS — Z79899 Other long term (current) drug therapy: Secondary | ICD-10-CM

## 2023-04-30 NOTE — Progress Notes (Signed)
   S: Patient presents for review of their specialty medication therapy.  Patient is currently taking Skyrizi for [employer]. Patient is managed by Dr. Steele Sizer for this.   Adherence: confirmed.   Efficacy: reports that it's working well for her.   Dosing: 360 mg total subcutaneous once every 8 weeks   Monitoring: S/sx of infection: none S/sx of hypersensitivity/injection site reaction: none  O: Lab Results  Component Value Date   WBC 7.4 10/07/2022   HGB 14.7 10/07/2022   HCT 42.4 10/07/2022   MCV 97.7 10/07/2022   PLT 316 10/07/2022      Chemistry      Component Value Date/Time   NA 141 10/07/2022 1139   K 4.0 10/07/2022 1139   CL 108 10/07/2022 1139   CO2 27 10/07/2022 1139   BUN 11 10/07/2022 1139   CREATININE 0.79 10/07/2022 1139   CREATININE 0.69 02/09/2020 1216      Component Value Date/Time   CALCIUM 9.0 10/07/2022 1139   ALKPHOS 82 10/07/2022 1139   AST 17 10/07/2022 1139   ALT 12 10/07/2022 1139   BILITOT 0.6 10/07/2022 1139       A/P: 1. Medication review: Patient currently on Skyrizi for Crohn's. Reviewed the medication with the patient, including the following: Cristy Folks is a monoclonal antibody used in the treatment of Crohn's. Patient educated on purpose, proper use and potential adverse effects of Skyrizi. Possible adverse effects are infections, headache, and injection site reactions. Live vaccinations should be avoided while on therapy. SubQ: Administer the two consecutive injections subcutaneously at different anatomic locations, such as thighs, abdomen, or back of upper arms. Intended for use under supervision of a health care professional; self-injection may occur after proper training (except back of upper arms). No recommendations for any changes at this time.  Butch Penny, PharmD, Patsy [place], CPP Clinical Pharmacist [facility] [phone]

## 2023-04-30 NOTE — Progress Notes (Signed)
Pharmacy Patient Advocate Encounter   Received notification from Patient Pharmacy that prior authorization for Prolia is required/requested.   Insurance verification completed.   The patient is insured through [facility] .   No chart notes to support diagnosis. Sent key for Covermymeds to provider. Key is [id number].

## 2023-05-01 ENCOUNTER — Other Ambulatory Visit: Payer: Self-pay

## 2023-05-01 NOTE — Progress Notes (Signed)
Pharmacy Patient Advocate Encounter  Received notification from [facility] that Prior Authorization for Prolia has been APPROVED from 04/30/23 to 04/28/24   PA #/Case ID/Reference #: [id number]

## 2023-05-02 ENCOUNTER — Other Ambulatory Visit: Payer: Self-pay

## 2023-05-05 ENCOUNTER — Other Ambulatory Visit (HOSPITAL_COMMUNITY): Payer: Self-pay

## 2023-05-05 ENCOUNTER — Other Ambulatory Visit: Payer: Self-pay

## 2023-05-05 MED ORDER — ESCITALOPRAM OXALATE 10 MG PO TABS
10.0000 mg | ORAL_TABLET | Freq: Every day | ORAL | 0 refills | Status: DC
  Filled 2023-05-05: qty 90, 90d supply, fill #0

## 2023-05-07 ENCOUNTER — Other Ambulatory Visit: Payer: Self-pay

## 2023-05-07 DIAGNOSIS — H52203 Unspecified astigmatism, bilateral: Secondary | ICD-10-CM | POA: Diagnosis not present

## 2023-05-12 DIAGNOSIS — M810 Age-related osteoporosis without current pathological fracture: Secondary | ICD-10-CM | POA: Diagnosis not present

## 2023-05-21 ENCOUNTER — Other Ambulatory Visit (HOSPITAL_COMMUNITY): Payer: Self-pay

## 2023-05-21 ENCOUNTER — Other Ambulatory Visit: Payer: Self-pay

## 2023-05-22 ENCOUNTER — Other Ambulatory Visit (HOSPITAL_COMMUNITY): Payer: Self-pay

## 2023-05-26 ENCOUNTER — Other Ambulatory Visit (HOSPITAL_COMMUNITY): Payer: Self-pay

## 2023-05-29 DIAGNOSIS — C171 Malignant neoplasm of jejunum: Secondary | ICD-10-CM | POA: Diagnosis not present

## 2023-05-29 DIAGNOSIS — K50818 Crohn's disease of both small and large intestine with other complication: Secondary | ICD-10-CM | POA: Diagnosis not present

## 2023-06-10 ENCOUNTER — Other Ambulatory Visit: Payer: Self-pay

## 2023-06-10 ENCOUNTER — Other Ambulatory Visit (HOSPITAL_COMMUNITY): Payer: Self-pay

## 2023-06-10 DIAGNOSIS — Z01419 Encounter for gynecological examination (general) (routine) without abnormal findings: Secondary | ICD-10-CM | POA: Diagnosis not present

## 2023-06-10 DIAGNOSIS — Z6821 Body mass index (BMI) 21.0-21.9, adult: Secondary | ICD-10-CM | POA: Diagnosis not present

## 2023-06-10 DIAGNOSIS — N951 Menopausal and female climacteric states: Secondary | ICD-10-CM | POA: Diagnosis not present

## 2023-06-10 DIAGNOSIS — F32A Depression, unspecified: Secondary | ICD-10-CM | POA: Diagnosis not present

## 2023-06-10 DIAGNOSIS — Z760 Encounter for issue of repeat prescription: Secondary | ICD-10-CM | POA: Diagnosis not present

## 2023-06-10 DIAGNOSIS — Z124 Encounter for screening for malignant neoplasm of cervix: Secondary | ICD-10-CM | POA: Diagnosis not present

## 2023-06-10 MED ORDER — PROGESTERONE MICRONIZED 100 MG PO CAPS
100.0000 mg | ORAL_CAPSULE | Freq: Every day | ORAL | 3 refills | Status: DC
  Filled 2023-06-10: qty 90, 90d supply, fill #0
  Filled 2023-10-08: qty 90, 90d supply, fill #1

## 2023-06-10 MED ORDER — ALPRAZOLAM 0.5 MG PO TABS
0.5000 mg | ORAL_TABLET | Freq: Every day | ORAL | 3 refills | Status: DC | PRN
  Filled 2023-06-10: qty 30, 30d supply, fill #0
  Filled 2023-07-17: qty 30, 30d supply, fill #1
  Filled 2023-09-07: qty 30, 30d supply, fill #2

## 2023-06-10 MED ORDER — ESCITALOPRAM OXALATE 10 MG PO TABS
10.0000 mg | ORAL_TABLET | Freq: Every day | ORAL | 3 refills | Status: AC
  Filled 2023-08-04: qty 90, 90d supply, fill #0
  Filled 2023-11-04: qty 90, 90d supply, fill #1
  Filled 2024-01-28: qty 90, 90d supply, fill #2

## 2023-06-10 MED ORDER — ESTRADIOL 1 MG PO TABS
1.0000 mg | ORAL_TABLET | Freq: Every day | ORAL | 3 refills | Status: AC
  Filled 2023-06-10: qty 90, 90d supply, fill #0
  Filled 2023-09-04: qty 90, 90d supply, fill #1
  Filled 2023-12-05: qty 90, 90d supply, fill #2
  Filled 2024-03-19: qty 90, 90d supply, fill #3

## 2023-06-20 ENCOUNTER — Other Ambulatory Visit: Payer: Self-pay

## 2023-06-20 ENCOUNTER — Other Ambulatory Visit: Payer: Self-pay | Admitting: Pharmacy Technician

## 2023-06-20 ENCOUNTER — Other Ambulatory Visit (HOSPITAL_COMMUNITY): Payer: Self-pay

## 2023-06-20 NOTE — Progress Notes (Signed)
     Specialty Pharmacy Refill Coordination Note  Jody Chen is a 63 y.o. female contacted today regarding refills of specialty medication(s) [name]   Patient requested Delivery   Delivery date: 06/24/23   Verified address: [address]  [name]   Medication will be filled on 06/23/23.

## 2023-06-23 ENCOUNTER — Other Ambulatory Visit: Payer: Self-pay

## 2023-06-25 ENCOUNTER — Other Ambulatory Visit (HOSPITAL_COMMUNITY): Payer: Self-pay

## 2023-07-05 IMAGING — PT NM PET TUM IMG INITIAL (PI) SKULL BASE T - THIGH
7 series · 25 of 25 positions shown · non-contrast
Comparison: CT enterography 07/18/20

CLINICAL DATA: Initial treatment strategy for small bowel
carcinoma.

EXAM:
NUCLEAR MEDICINE PET SKULL BASE TO THIGH
TECHNIQUE: 6.31 mCi F-18 FDG was injected intravenously. Full-ring PET imaging
was performed from the skull base to thigh after the radiotracer. CT
data was obtained and used for attenuation correction and anatomic
localization.
Fasting blood glucose: 98 mg/dl

[Series 3: pet sk_thigh ac · axial · 5.0mm · 4.07mm/px · z∈[-888,-8]mm · 6 of 221 slices shown]
[im 1/221]
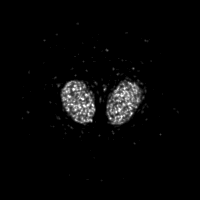
[im 45/221]
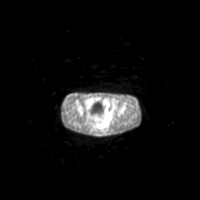
[im 89/221]
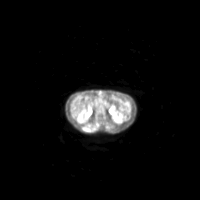
[im 133/221]
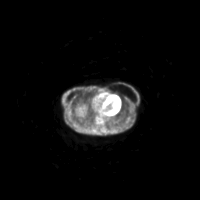
[im 177/221]
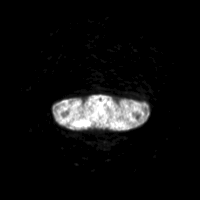
[im 221/221]
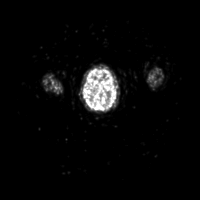

[Series 4: ct sk_thigh 5.0 bf37 · axial · 5.0mm · 0.86mm/px · z∈[-888,-8]mm · 5 of 221 slices shown]
[im 1/221]
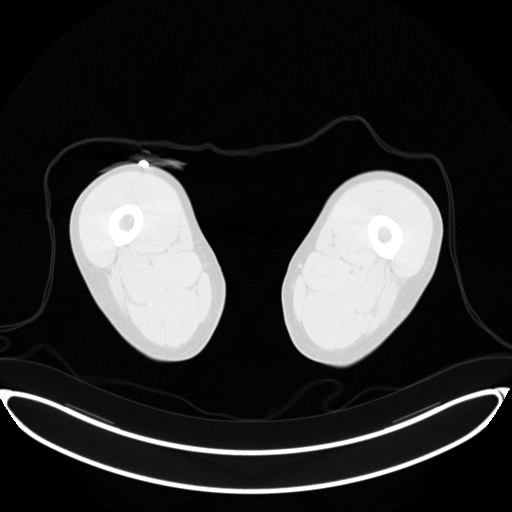
[im 56/221]
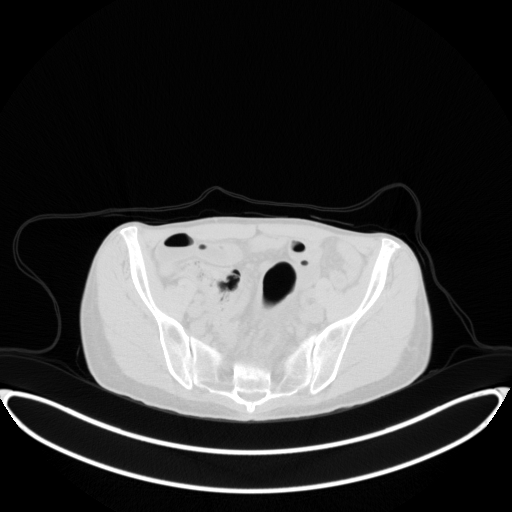
[im 111/221]
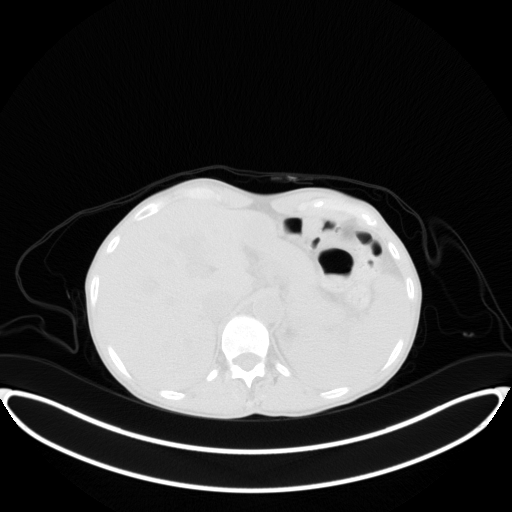
[im 166/221]
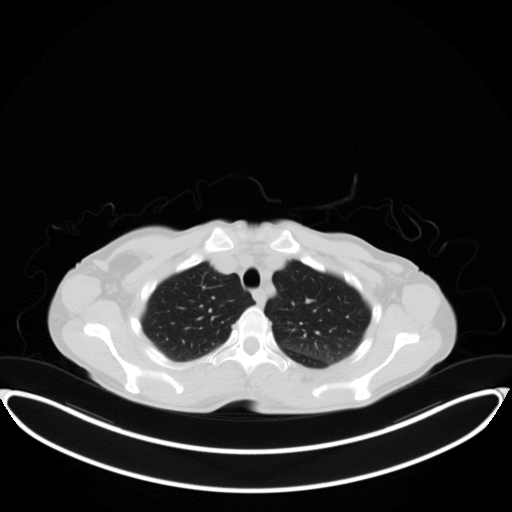
[im 221/221  brain]
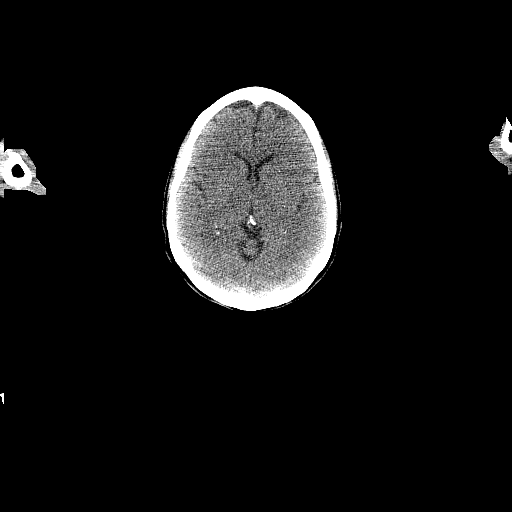

[Series 5: pet sk_thigh nac · axial · 5.0mm · 4.07mm/px · z∈[-888,-8]mm · 5 of 221 slices shown]
[im 1/221]
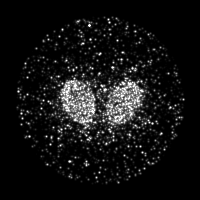
[im 56/221]
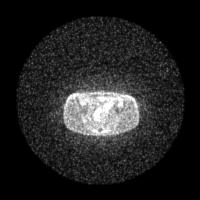
[im 111/221]
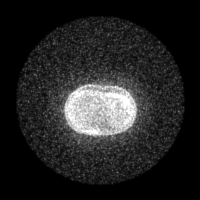
[im 166/221]
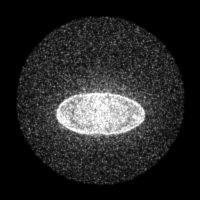
[im 221/221]
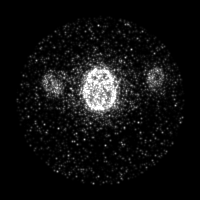

[Series 8: ct sk_thigh 5.0 br59 lung_bone · axial · 5.0mm · 0.61mm/px · z∈[-440,-164]mm · 2 of 70 slices shown]
[im 1/70]
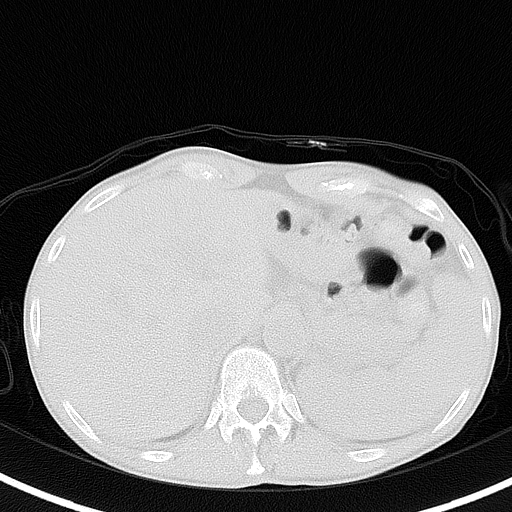
[im 70/70]
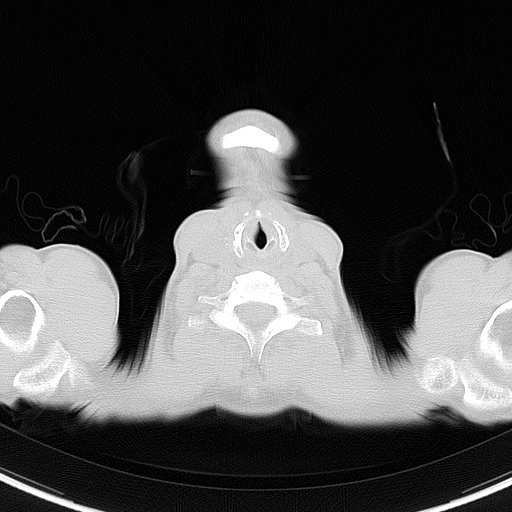

[Series 603: fused cor · 1 of 43 slices shown]
[im 1/43]
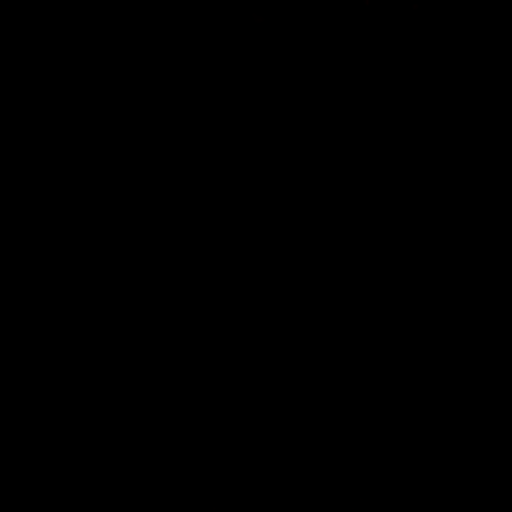

[Series 604: <mip collection> · coronal · 1.83mm/px · 1 of 32 slices shown]
[im 1/32]
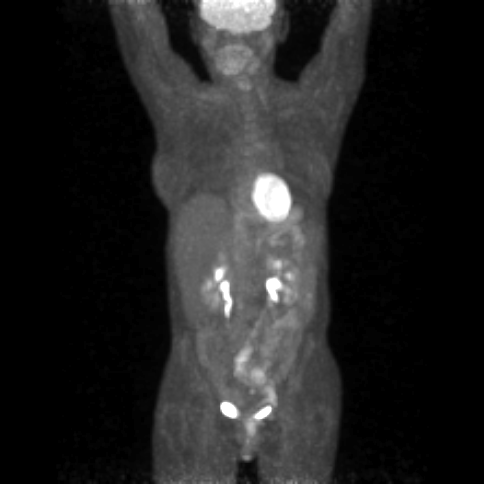

[Series 605: range-ct sk_thigh 5.0 bf37-tra-<alpha range> · 5 of 213 slices shown]
[im 1/213]
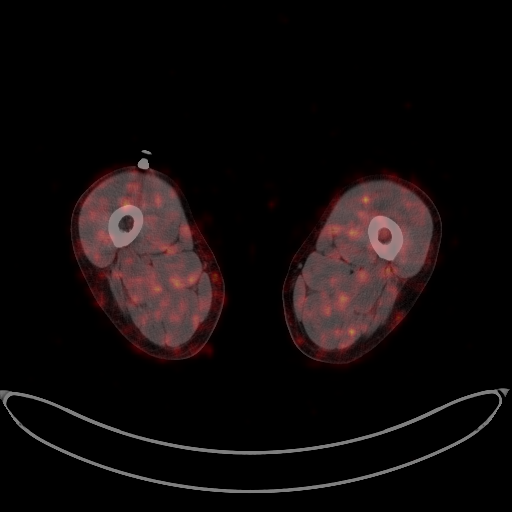
[im 54/213]
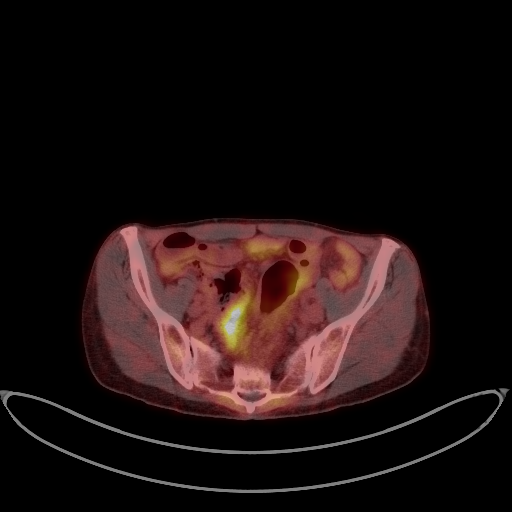
[im 107/213]
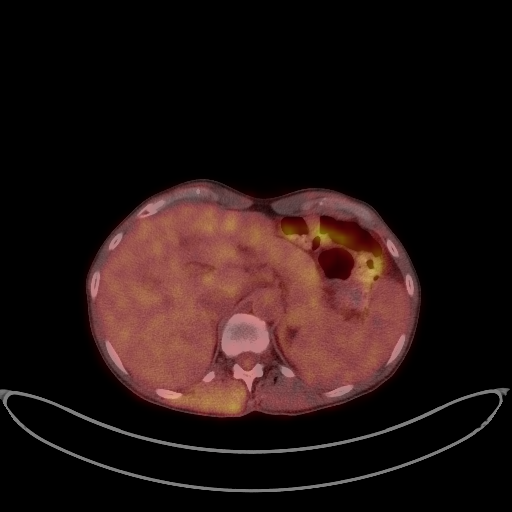
[im 160/213]
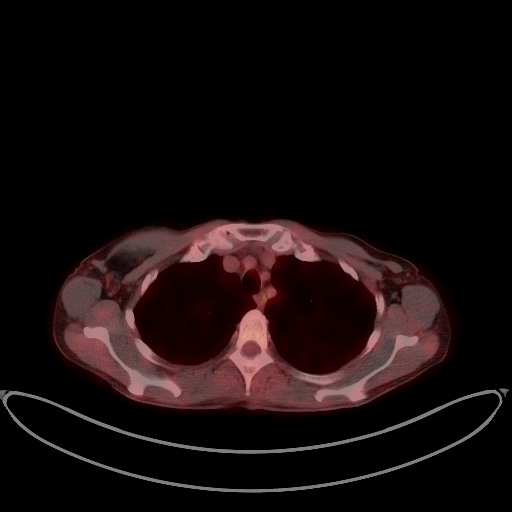
[im 213/213]
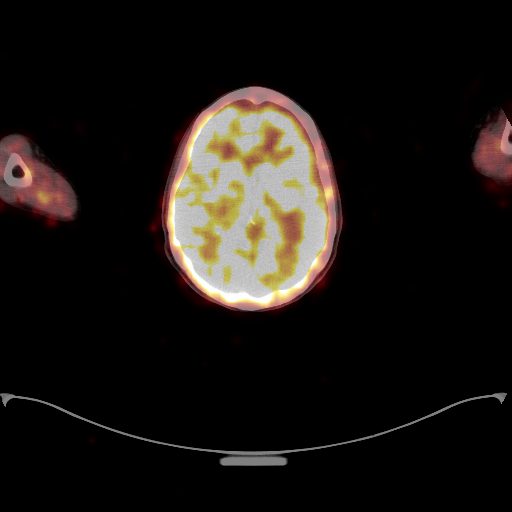

[25 of 25 positions shown; findings below may reference images not displayed]

FINDINGS: Mediastinal blood pool activity: SUV max

Liver activity: SUV max NA

NECK: No hypermetabolic lymph nodes in the neck.

Incidental CT findings: Thyroidectomy

CHEST: No hypermetabolic mediastinal or hilar nodes. No suspicious
pulmonary nodules on the CT scan.

Incidental CT findings: 4 mm left upper lobe non-solid nodule is too
small to reliably characterize by PET-CT, image [DATE]. Mild aortic
atherosclerosis. Small pericardial effusion noted around the base of
heart, image 97/4.

ABDOMEN/PELVIS: No abnormal hypermetabolic activity within the
liver, pancreas, adrenal glands, or spleen. No hypermetabolic lymph
nodes in the abdomen or pelvis.

Incidental CT findings: There is increased radiotracer uptake
localizing to thickened loop of sigmoid colon likely a manifestation
of patient's known Crohn's disease. A small volume of free fluid is
noted within the dependent portion of the pelvis, likely
postinflammatory.

SKELETON: No focal hypermetabolic activity to suggest skeletal
metastasis.

Incidental CT findings: none
IMPRESSION: 1. No specific findings to suggest FDG avid residual tumor or
metastatic disease.
2. Non-solid nodule within the left upper lobe measures 4 mm. No
follow-up recommended. This recommendation follows the consensus
statement: Guidelines for Management of Incidental Pulmonary Nodules
Detected on CT Images: From the [HOSPITAL] 0594; Radiology
3. Suspect active Crohn's colitis involving the sigmoid colon.
4.  Aortic Atherosclerosis ([id number]).

## 2023-07-15 ENCOUNTER — Other Ambulatory Visit (HOSPITAL_COMMUNITY): Payer: Self-pay

## 2023-07-15 ENCOUNTER — Other Ambulatory Visit: Payer: Self-pay

## 2023-07-16 ENCOUNTER — Other Ambulatory Visit: Payer: Self-pay

## 2023-07-16 ENCOUNTER — Other Ambulatory Visit (HOSPITAL_COMMUNITY): Payer: Self-pay

## 2023-07-17 ENCOUNTER — Other Ambulatory Visit: Payer: Self-pay

## 2023-07-17 ENCOUNTER — Other Ambulatory Visit (HOSPITAL_COMMUNITY): Payer: Self-pay

## 2023-07-26 ENCOUNTER — Other Ambulatory Visit (HOSPITAL_COMMUNITY): Payer: Self-pay

## 2023-08-04 ENCOUNTER — Other Ambulatory Visit (HOSPITAL_COMMUNITY): Payer: Self-pay

## 2023-08-04 ENCOUNTER — Other Ambulatory Visit: Payer: Self-pay

## 2023-08-04 DIAGNOSIS — Z1231 Encounter for screening mammogram for malignant neoplasm of breast: Secondary | ICD-10-CM | POA: Diagnosis not present

## 2023-08-05 ENCOUNTER — Other Ambulatory Visit: Payer: Self-pay

## 2023-08-06 ENCOUNTER — Other Ambulatory Visit (HOSPITAL_COMMUNITY): Payer: Self-pay

## 2023-08-06 ENCOUNTER — Other Ambulatory Visit: Payer: Self-pay

## 2023-08-06 MED ORDER — LEVOTHYROXINE SODIUM 200 MCG PO TABS
200.0000 ug | ORAL_TABLET | Freq: Every morning | ORAL | 11 refills | Status: DC
  Filled 2023-08-06: qty 30, 30d supply, fill #0
  Filled 2023-09-03: qty 30, 30d supply, fill #1

## 2023-08-08 ENCOUNTER — Other Ambulatory Visit: Payer: Self-pay | Admitting: Pharmacy Technician

## 2023-08-08 ENCOUNTER — Other Ambulatory Visit: Payer: Self-pay

## 2023-08-08 NOTE — Progress Notes (Signed)
 Specialty Pharmacy Refill Coordination Note  Jody Chen is a 64 y.o. female contacted today regarding refills of specialty medication(s) Risankizumab -rzaa (Skyrizi )   Patient requested Delivery   Delivery date: 08/21/23   Verified address: [address]  [place] [address]   Medication will be filled on 08/20/23.

## 2023-08-11 ENCOUNTER — Other Ambulatory Visit (HOSPITAL_COMMUNITY): Payer: Self-pay

## 2023-08-11 DIAGNOSIS — K50019 Crohn's disease of small intestine with unspecified complications: Secondary | ICD-10-CM | POA: Diagnosis not present

## 2023-08-11 DIAGNOSIS — G43E09 Chronic migraine with aura, not intractable, without status migrainosus: Secondary | ICD-10-CM | POA: Diagnosis not present

## 2023-08-11 DIAGNOSIS — M810 Age-related osteoporosis without current pathological fracture: Secondary | ICD-10-CM | POA: Diagnosis not present

## 2023-08-11 DIAGNOSIS — R519 Headache, unspecified: Secondary | ICD-10-CM | POA: Diagnosis not present

## 2023-08-11 DIAGNOSIS — E890 Postprocedural hypothyroidism: Secondary | ICD-10-CM | POA: Diagnosis not present

## 2023-08-11 DIAGNOSIS — F419 Anxiety disorder, unspecified: Secondary | ICD-10-CM | POA: Diagnosis not present

## 2023-08-11 MED ORDER — SUMATRIPTAN SUCCINATE 100 MG PO TABS
ORAL_TABLET | ORAL | 2 refills | Status: DC
  Filled 2023-08-11: qty 18, 30d supply, fill #0
  Filled 2023-09-07: qty 18, 30d supply, fill #1
  Filled 2023-10-08: qty 18, 30d supply, fill #2

## 2023-08-11 MED ORDER — NURTEC 75 MG PO TBDP
ORAL_TABLET | ORAL | 2 refills | Status: DC
Start: 2023-08-11 — End: 2023-11-21
  Filled 2023-08-11: qty 15, 30d supply, fill #0
  Filled 2023-09-04 – 2023-09-08 (×3): qty 15, 30d supply, fill #1

## 2023-08-13 ENCOUNTER — Other Ambulatory Visit (HOSPITAL_COMMUNITY): Payer: Self-pay

## 2023-08-20 ENCOUNTER — Other Ambulatory Visit: Payer: Self-pay

## 2023-08-21 ENCOUNTER — Other Ambulatory Visit: Payer: Self-pay

## 2023-08-27 ENCOUNTER — Other Ambulatory Visit: Payer: Self-pay

## 2023-09-03 ENCOUNTER — Other Ambulatory Visit (HOSPITAL_COMMUNITY): Payer: Self-pay

## 2023-09-04 ENCOUNTER — Other Ambulatory Visit (HOSPITAL_COMMUNITY): Payer: Self-pay

## 2023-09-04 ENCOUNTER — Other Ambulatory Visit: Payer: Self-pay

## 2023-09-04 MED ORDER — LEVOTHYROXINE SODIUM 200 MCG PO TABS
200.0000 ug | ORAL_TABLET | Freq: Every morning | ORAL | 11 refills | Status: AC
  Filled 2023-09-04: qty 30, 30d supply, fill #0
  Filled 2023-11-04: qty 90, 90d supply, fill #0

## 2023-09-08 ENCOUNTER — Other Ambulatory Visit (HOSPITAL_COMMUNITY): Payer: Self-pay

## 2023-09-08 ENCOUNTER — Other Ambulatory Visit: Payer: Self-pay

## 2023-09-08 ENCOUNTER — Other Ambulatory Visit (HOSPITAL_BASED_OUTPATIENT_CLINIC_OR_DEPARTMENT_OTHER): Payer: Self-pay

## 2023-09-10 DIAGNOSIS — L720 Epidermal cyst: Secondary | ICD-10-CM | POA: Diagnosis not present

## 2023-09-10 DIAGNOSIS — L821 Other seborrheic keratosis: Secondary | ICD-10-CM | POA: Diagnosis not present

## 2023-09-10 DIAGNOSIS — L570 Actinic keratosis: Secondary | ICD-10-CM | POA: Diagnosis not present

## 2023-09-26 DIAGNOSIS — D1801 Hemangioma of skin and subcutaneous tissue: Secondary | ICD-10-CM | POA: Diagnosis not present

## 2023-09-26 DIAGNOSIS — L821 Other seborrheic keratosis: Secondary | ICD-10-CM | POA: Diagnosis not present

## 2023-09-26 DIAGNOSIS — D225 Melanocytic nevi of trunk: Secondary | ICD-10-CM | POA: Diagnosis not present

## 2023-09-26 DIAGNOSIS — L570 Actinic keratosis: Secondary | ICD-10-CM | POA: Diagnosis not present

## 2023-09-26 DIAGNOSIS — Z85828 Personal history of other malignant neoplasm of skin: Secondary | ICD-10-CM | POA: Diagnosis not present

## 2023-09-26 DIAGNOSIS — D485 Neoplasm of uncertain behavior of skin: Secondary | ICD-10-CM | POA: Diagnosis not present

## 2023-10-02 ENCOUNTER — Other Ambulatory Visit (HOSPITAL_COMMUNITY): Payer: Self-pay

## 2023-10-06 ENCOUNTER — Other Ambulatory Visit: Payer: Self-pay

## 2023-10-08 ENCOUNTER — Other Ambulatory Visit: Payer: Self-pay

## 2023-10-08 ENCOUNTER — Other Ambulatory Visit (HOSPITAL_COMMUNITY): Payer: Self-pay

## 2023-10-08 DIAGNOSIS — E890 Postprocedural hypothyroidism: Secondary | ICD-10-CM | POA: Diagnosis not present

## 2023-10-08 DIAGNOSIS — G43E09 Chronic migraine with aura, not intractable, without status migrainosus: Secondary | ICD-10-CM | POA: Diagnosis not present

## 2023-10-08 DIAGNOSIS — Z13220 Encounter for screening for lipoid disorders: Secondary | ICD-10-CM | POA: Diagnosis not present

## 2023-10-08 DIAGNOSIS — F419 Anxiety disorder, unspecified: Secondary | ICD-10-CM | POA: Diagnosis not present

## 2023-10-08 DIAGNOSIS — Z0000 Encounter for general adult medical examination without abnormal findings: Secondary | ICD-10-CM | POA: Diagnosis not present

## 2023-10-08 DIAGNOSIS — K50019 Crohn's disease of small intestine with unspecified complications: Secondary | ICD-10-CM | POA: Diagnosis not present

## 2023-10-08 DIAGNOSIS — M810 Age-related osteoporosis without current pathological fracture: Secondary | ICD-10-CM | POA: Diagnosis not present

## 2023-10-08 MED ORDER — ESTRADIOL 0.25 MG/0.25GM TD GEL
1.0000 | Freq: Every day | TRANSDERMAL | 0 refills | Status: DC
  Filled 2023-10-08: qty 90, 90d supply, fill #0

## 2023-10-16 ENCOUNTER — Other Ambulatory Visit: Payer: Self-pay

## 2023-10-17 ENCOUNTER — Other Ambulatory Visit: Payer: Self-pay

## 2023-10-17 ENCOUNTER — Other Ambulatory Visit (HOSPITAL_COMMUNITY): Payer: Self-pay

## 2023-10-17 NOTE — Progress Notes (Signed)
 Benefits Investigation Started by [employer]

## 2023-10-17 NOTE — Progress Notes (Signed)
 Specialty Pharmacy Refill Coordination Note  Jody Chen is a 63 y.o. female contacted today regarding refills of specialty medication(s) Denosumab  (Prolia )   Patient requested Courier to Provider Office   Delivery date: 11/04/23   Verified address: [facility] [address] [place] [address]   Medication will be filled on 11/03/23.

## 2023-10-20 ENCOUNTER — Other Ambulatory Visit: Payer: Self-pay

## 2023-10-21 ENCOUNTER — Telehealth (HOSPITAL_COMMUNITY): Payer: Self-pay

## 2023-10-21 ENCOUNTER — Other Ambulatory Visit (HOSPITAL_COMMUNITY): Payer: Self-pay

## 2023-10-21 ENCOUNTER — Other Ambulatory Visit: Payer: Self-pay

## 2023-10-21 NOTE — Telephone Encounter (Signed)
 Pharmacy Patient Advocate Encounter  Received notification from [facility] that Prior Authorization for Skyrizi  has been APPROVED from 10/21/23 to 10/19/24   PA #/Case ID/Reference #: [id number]

## 2023-10-21 NOTE — Telephone Encounter (Signed)
 Pharmacy Patient Advocate Encounter   Received notification from Patient Pharmacy that prior authorization for Skyrizi  is required/requested.   Insurance verification completed.   The patient is insured through [facility] .   Per test claim: PA required; PA submitted to above mentioned insurance via Latent Key/confirmation #/EOC [id number] Status is pending

## 2023-10-21 NOTE — Progress Notes (Signed)
 Specialty Pharmacy Refill Coordination Note  Jody Chen is a 63 y.o. female contacted today regarding refills of specialty medication(s) Risankizumab -rzaa (Skyrizi )   Patient requested Delivery   Delivery date: 10/23/23   Verified address: [address] [address]   Medication will be filled on 07.16.25.

## 2023-10-21 NOTE — Progress Notes (Signed)
 PA approved.

## 2023-10-21 NOTE — Progress Notes (Signed)
 PA pending

## 2023-11-01 ENCOUNTER — Other Ambulatory Visit (HOSPITAL_COMMUNITY): Payer: Self-pay

## 2023-11-03 ENCOUNTER — Other Ambulatory Visit: Payer: Self-pay

## 2023-11-04 ENCOUNTER — Other Ambulatory Visit (HOSPITAL_COMMUNITY): Payer: Self-pay

## 2023-11-04 ENCOUNTER — Other Ambulatory Visit: Payer: Self-pay

## 2023-11-04 DIAGNOSIS — M810 Age-related osteoporosis without current pathological fracture: Secondary | ICD-10-CM | POA: Diagnosis not present

## 2023-11-04 DIAGNOSIS — K909 Intestinal malabsorption, unspecified: Secondary | ICD-10-CM | POA: Diagnosis not present

## 2023-11-04 DIAGNOSIS — C73 Malignant neoplasm of thyroid gland: Secondary | ICD-10-CM | POA: Diagnosis not present

## 2023-11-04 DIAGNOSIS — E890 Postprocedural hypothyroidism: Secondary | ICD-10-CM | POA: Diagnosis not present

## 2023-11-04 MED ORDER — SUMATRIPTAN SUCCINATE 100 MG PO TABS
ORAL_TABLET | ORAL | 2 refills | Status: DC
  Filled 2023-11-04: qty 18, 30d supply, fill #0
  Filled 2023-12-05: qty 18, 30d supply, fill #1
  Filled 2024-01-02: qty 18, 30d supply, fill #2

## 2023-11-11 DIAGNOSIS — M810 Age-related osteoporosis without current pathological fracture: Secondary | ICD-10-CM | POA: Diagnosis not present

## 2023-11-20 ENCOUNTER — Encounter: Payer: Self-pay | Admitting: *Deleted

## 2023-11-21 ENCOUNTER — Other Ambulatory Visit (HOSPITAL_COMMUNITY): Payer: Self-pay

## 2023-11-21 ENCOUNTER — Ambulatory Visit (INDEPENDENT_AMBULATORY_CARE_PROVIDER_SITE_OTHER): Admitting: Neurology

## 2023-11-21 VITALS — BP 115/69 | HR 75 | Ht 69.5 in | Wt 141.0 lb

## 2023-11-21 DIAGNOSIS — G43711 Chronic migraine without aura, intractable, with status migrainosus: Secondary | ICD-10-CM | POA: Diagnosis not present

## 2023-11-21 NOTE — Patient Instructions (Addendum)
 Start Ajovy Today and prescribe for her    Discussed:  There is increased risk for stroke in women with migraine with aura and a contraindication for the combined contraceptive pill for use by women who have migraine with aura. The risk for women with migraine without aura is lower. However other risk factors like smoking are far more likely to increase stroke risk than migraine. There is a recommendation for no smoking and for the use of OCPs without estrogen such as progestogen only pills particularly for women with migraine with aura.SABRA People who have migraine headaches with auras may be 3 times more likely to have a stroke caused by a blood clot, compared to migraine patients who don't see auras. Women who take hormone-replacement therapy may be 30 percent more likely to suffer a clot-based stroke than women not taking medication containing estrogen. Other risk factors like smoking and high blood pressure may be  much more important. And stroke is still a rare complication due to migraine aura and is controversial and lower doses may not cause a risk.  Fremanezumab Injection What is this medication? FREMANEZUMAB (fre ma NEZ ue mab) prevents migraines. It works by blocking a substance in the body that causes migraines. It is a monoclonal antibody. This medicine may be used for other purposes; ask your health care provider or pharmacist if you have questions. COMMON BRAND NAME(S): AJOVY What should I tell my care team before I take this medication? They need to know if you have any of these conditions: An unusual or allergic reaction to fremanezumab, other medications, foods, dyes, or preservatives Pregnant or trying to get pregnant Breast-feeding How should I use this medication? This medication is injected under the skin. You will be taught how to prepare and give it. Take it as directed on the prescription label. Keep taking it unless your care team tells you to stop. It is important that  you put your used needles and syringes in a special sharps container. Do not put them in a trash can. If you do not have a sharps container, call your pharmacist or care team to get one. Talk to your care team about the use of this medication in children. Special care may be needed. Overdosage: If you think you have taken too much of this medicine contact a poison control center or emergency room at once. NOTE: This medicine is only for you. Do not share this medicine with others. What if I miss a dose? If you miss a dose, take it as soon as you can. If it is almost time for your next dose, take only that dose. Do not take double or extra doses. What may interact with this medication? Interactions are not expected. This list may not describe all possible interactions. Give your health care provider a list of all the medicines, herbs, non-prescription drugs, or dietary supplements you use. Also tell them if you smoke, drink alcohol, or use illegal drugs. Some items may interact with your medicine. What should I watch for while using this medication? Tell your care team if your symptoms do not start to get better or if they get worse. What side effects may I notice from receiving this medication? Side effects that you should report to your care team as soon as possible: Allergic reactions or angioedema--skin rash, itching or hives, swelling of the face, eyes, lips, tongue, arms, or legs, trouble swallowing or breathing Side effects that usually do not require medical attention (report to your care  team if they continue or are bothersome): Pain, redness, or irritation at injection site This list may not describe all possible side effects. Call your doctor for medical advice about side effects. You may report side effects to FDA at 1-800-FDA-1088. Where should I keep my medication? Keep out of the reach of children and pets. Store in a refrigerator or at room temperature between 20 and 25 degrees C  (68 and 77 degrees F). Refrigeration (preferred): Store in the refrigerator. Do not freeze. Keep in the original container until you are ready to take it. Remove the dose from the carton about 30 minutes before it is time for you to use it. If the dose is not used, it may be stored in the original container at room temperature for 7 days. Get rid of any unused medication after the expiration date. Room Temperature: This medication may be stored at room temperature for up to 7 days. Keep it in the original container. Protect from light until time of use. If it is stored at room temperature, get rid of any unused medication after 7 days or after it expires, whichever is first. To get rid of medications that are no longer needed or have expired: Take the medication to a medication take-back program. Check with your pharmacy or law enforcement to find a location. If you cannot return the medication, ask your pharmacist or care team how to get rid of this medication safely. NOTE: This sheet is a summary. It may not cover all possible information. If you have questions about this medicine, talk to your doctor, pharmacist, or health care provider.  2024 Elsevier/Gold Standard (2021-05-18 00:00:00)

## 2023-11-21 NOTE — Progress Notes (Signed)
 [facility]    Provider:  Dr Ines Requesting Provider: Dwight Trula SQUIBB, MD Primary Care Provider:  Pcp, No  CC:  migraines  HPI:  Jody Chen is a 63 y.o. female here as requested by Dwight Trula SQUIBB, MD for chronic migraines. has Abdominal pain; Crohn's disease of ileum (HCC); Anxiety; Gross hematuria; Insomnia; Menopause present; Migraine; Personal history of other malignant neoplasm of skin; Reduced libido; Urinary tract infectious disease; Acquired hallux valgus, right; 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HCC); and Post-menopausal osteoporosis on their problem list.  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  She has increased headaches in the past several months, mostly on the left side, usually 3 times per week but could last 4 to 5 days in a row's, had associated light and sound sensitivity and often has associated aura.  Nurtec for the PA was approved.  She has struggled with migraines for many years. She saw a headache doctor in the past. Mostly the end of the day and if she goes to sleep with a headache she can wake up with them. Headaches/migraines can last 5 days. Alcohol triggers it, sugar, weather, sleeping, Also Worsening. No FHx of migraines.  no medication overuse, no aura. Pulsating/pounding/throbbing, light and sound sensitivity, nausea, hurts to move, migraines are moderate to severe, unilateral but can spread to be holocephalic. They are significantly affecting quality of life. Behind the eyes, used to be more unilateral, never really vomited from the migraines, a dark quiet room helps, last up to 5 days, for > last year she has daily headaches, >15 migaine days a month, she had MRi of the brain in the past and prefers to hold off on  imaging because she has no new symptoms, no new vision changes, not exertional, no changes in quaity or quantity or severity ongoingly chronic.  She has had rare vision changes unclear if aura. On estradiol . Did discuss increased risk of stroke in patients with migraine with aura on estradiol  however unclear of description fits aura discussed. No other focal neurologic deficits, associated symptoms, inciting events or modifiable factors.  Reviewed notes, labs and imaging from outside physicians, which showed:  From a thorough review of records and patient report, Medications tried that can be used in migraine/headache management greater than 3 months include: Lifestyle modification, headache diaries, better sleep hygiene, exercise, management of migraine triggers, OTC and prescribed analgesics/nsaids such as ibuprofen, excedrin, alleve and others, Aimovig contraindicated due to constipation, she has taken amitriptyline in the past but currently she cannot take amitriptyline or nortriptyline due to being on Lexapro  and risk of serotonergic syndrome, she has tried topiramate ineffective.  She was prescribed Nurtec every other day was ineffective.  She tried metoprolol (not effective) but currently Propranolol and other blood pressure medications contraindicated due to hypotension.  Topiramate was ineffective and titrated to the highest dose possible. Also tried, depakote,  sumatriptan , rizatriptan, flexiril(muscle relaxers sedating).    Review of Systems: Patient complains of symptoms per HPI as well as the following symptoms per hpi. Pertinent negatives and positives per HPI. All others negative.   Social History   Socioeconomic History   Marital status: Married    Spouse name: Not on file   Number of children: Not on file   Years of education: Not on file  Highest education level: Not on file  Occupational History   Not on file  Tobacco Use   Smoking status: Never   Smokeless tobacco: Never   Vaping Use   Vaping status: Never Used  Substance and Sexual Activity   Alcohol use: Yes    Comment: 1 - 2 glasses weekly   Drug use: No   Sexual activity: Not on file  Other Topics Concern   Not on file  Social History Narrative   Not on file   Social Drivers of Health   Financial Resource Strain: Low Risk  (05/23/2021)   [facility]   Overall Financial Resource Strain (CARDIA)    Difficulty of Paying Living Expenses: Not hard at all  Food Insecurity: Low Risk  (01/30/2023)   Received from [facility]   Hunger Vital Sign    Within the past 12 months, you worried that your food would run out before you got money to buy more: Never true    Within the past 12 months, the food you bought just didn't last and you didn't have money to get more. : Never true  Transportation Needs: No Transportation Needs (01/30/2023)   Received from [facility]    In the past 12 months, has lack of reliable transportation kept you from medical appointments, meetings, work or from getting things needed for daily living? : No  Physical Activity: Sufficiently Active (05/23/2021)   Received from [facility]   Exercise Vital Sign    On average, how many days per week do you engage in moderate to strenuous exercise (like a brisk walk)?: 4 days    On average, how many minutes do you engage in exercise at this level?: 50 min  Stress: Not on file  Social Connections: Unknown (08/21/2021)   Received from [facility]   Social Network    Social Network: Not on file  Intimate Partner Violence: Unknown (07/13/2021)   Received from [facility]   HITS    Physically Hurt: Not on file    Insult or Talk Down To: Not on file    Threaten Physical Harm: Not on file    Scream or Curse: Not on file    Family History  Problem Relation Age of Onset   Migraines Neg Hx     Past Medical History:  Diagnosis Date   Abdominal distention    Abdominal pain    Cancer (HCC)     thyroid   Constipation    Crohn's    Migraines    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   Thyroid disease     Patient Active Problem List   Diagnosis Date Noted   Post-menopausal osteoporosis 06/25/2022   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HCC) 10/24/2020   Acquired hallux valgus, right 05/10/2019   Anxiety 03/03/2019   Insomnia 03/03/2019   Menopause present 03/03/2019   Migraine 03/03/2019   Reduced libido 03/03/2019   Urinary tract infectious disease 03/03/2019   Gross hematuria 08/30/2016   Personal history of other malignant neoplasm of skin 07/17/2016   Crohn's disease of ileum (HCC) 05/06/2011   Abdominal pain     Past Surgical History:  Procedure Laterality Date   BREAST SURGERY     COLON SURGERY     segmental jejunal small bowe resection      THYROIDECTOMY, PARTIAL  11/06/1992    Current Outpatient Medications  Medication Sig Dispense Refill   ALPRAZolam (XANAX) 0.5 MG tablet Take 1 tablet (0.5 mg total)  by mouth daily as needed for anxiety 60 tablet 0   Azelastine  HCl 137 MCG/SPRAY SOLN Place 1 spray into both nostrils 2 (two) times daily. 30 mL 5   denosumab  (PROLIA ) 60 MG/ML SOSY injection 60mg  Subcutaneous once every 6 months 180 days 1 mL 1   escitalopram  (LEXAPRO ) 10 MG tablet Take 1 tablet (10 mg total) by mouth daily. 90 tablet 3   estradiol  (ESTRACE ) 1 MG tablet Take 1 tablet (1 mg total) by mouth daily. 90 tablet 3   fluorouracil  (EFUDEX ) 5 % cream Apply 1 Application topically 2 (two) times daily. HOLD FOR SEVERE INFLAMMATION 40 g 0   fluticasone  (FLONASE ) 50 MCG/ACT nasal spray Place 1 spray into both nostrils 2 (two) times daily. 16 g 5   Fremanezumab -vfrm (AJOVY ) 225 MG/1.5ML SOAJ Inject 225 mg into the skin every 30 (thirty) days. Please run copay card: BIN# [id number] PCN# [id number] GRP# [id number] ID# [id number] EXP 04/07/2024 1.5 mL 11   levocetirizine (XYZAL ) 5 MG tablet Take 1 tablet by mouth once daily 30 tablet 5   levothyroxine  (SYNTHROID ) 200 MCG tablet Take 1 tablet  (200 mcg total) by mouth every morning on an empty stomach. 30 tablet 11   Risankizumab -rzaa (SKYRIZI ) 360 MG/2.4ML SOCT Inject 360 mg under the skin every 8 (eight) weeks. 2.4 mL 4   SUMAtriptan  (IMITREX ) 100 MG tablet 1 tablet as needed, may take second dose at least 2 hours after first dose up to 2 tablets per day as needed Orally Once a day 18 tablet 2   SYNTHROID  200 MCG tablet Take 1 tablet (200 mcg total) by mouth every morning on an empty stomach 30 tablet 3   testosterone  (ANDROGEL ) 50 MG/5GM (1%) GEL Place 5 g onto the skin daily.     valACYclovir  (VALTREX ) 500 MG tablet Take 1 tablet by mouth 2 times daily for 5 days;  starting 1 day prior to procedure. 10 tablet 0   No current facility-administered medications for this visit.    Allergies as of 11/21/2023 - Review Complete 11/21/2023  Allergen Reaction Noted   Butorphanol  03/03/2019   Sulfa antibiotics  03/03/2019    Vitals: BP 115/69   Pulse 75   Ht 5' 9.5 (1.765 m)   Wt 141 lb (64 kg)   BMI 20.52 kg/m  Last Weight:  Wt Readings from Last 1 Encounters:  11/21/23 141 lb (64 kg)   Last Height:   Ht Readings from Last 1 Encounters:  11/21/23 5' 9.5 (1.765 m)     Physical exam: Exam: Gen: NAD, conversant, well nourised, well groomed                     CV: RRR, no MRG. No Carotid Bruits. No peripheral edema, warm, nontender Eyes: Conjunctivae clear without exudates or hemorrhage  Neuro: Detailed Neurologic Exam  Speech:    Speech is normal; fluent and spontaneous with normal comprehension.  Cognition:    The patient is oriented to person, place, and time;     recent and remote memory intact;     language fluent;     normal attention, concentration,     fund of knowledge Cranial Nerves:    The pupils are equal, round, and reactive to light. The fundi are normal and spontaneous venous pulsations are present. Visual fields are full to finger confrontation. Extraocular movements are intact. Trigeminal  sensation is intact and the muscles of mastication are normal. The face is symmetric. The palate elevates in the  midline. Hearing intact. Voice is normal. Shoulder shrug is normal. The tongue has normal motion without fasciculations.   Coordination: normal  Gait: normal  Motor Observation:    No asymmetry, no atrophy, and no involuntary movements noted. Tone:    Normal muscle tone.    Posture:    Posture is normal. normal erect    Strength:    Strength is V/V in the upper and lower limbs.      Sensation: intact to LT     Reflex Exam:  DTR's:    Deep tendon reflexes in the upper and lower extremities are normal bilaterally.   Toes:    The toes are downgoing bilaterally.   Clonus:    Clonus is absent.    Assessment/Plan: Patient with chronic migraines, failed multiple acute and preventative medications.  At this time we will start Ajovy for migraine prevention Continue sumatriptan for acute management  No orders of the defined types were placed in this encounter.  Meds ordered this encounter  Medications   Fremanezumab-vfrm (AJOVY) 225 MG/1.5ML SOAJ    Sig: Inject 225 mg into the skin every 30 (thirty) days. Please run copay card: BIN# [id number] PCN# [id number] GRP# [id number] ID# [id number] EXP 04/07/2024    Dispense:  1.5 mL    Refill:  11    Please run copay card: BIN# [id number] PCN# [id number] GRP# [id number] ID# [id number] EXP 04/07/2024    Cc: Dwight Trula SQUIBB, MD,  Pcp, No  Onetha Epp, MD  [facility] [address] [place], [place] [address]  Phone [phone] Fax [phone]

## 2023-11-23 ENCOUNTER — Encounter: Payer: Self-pay | Admitting: Neurology

## 2023-11-23 MED ORDER — AJOVY 225 MG/1.5ML ~~LOC~~ SOAJ: 225.0000 mg | SUBCUTANEOUS | 11 refills | Status: DC

## 2023-11-26 ENCOUNTER — Other Ambulatory Visit: Payer: Self-pay

## 2023-12-01 DIAGNOSIS — M810 Age-related osteoporosis without current pathological fracture: Secondary | ICD-10-CM | POA: Diagnosis not present

## 2023-12-03 ENCOUNTER — Other Ambulatory Visit: Payer: Self-pay

## 2023-12-05 ENCOUNTER — Other Ambulatory Visit: Payer: Self-pay | Admitting: Internal Medicine

## 2023-12-05 ENCOUNTER — Other Ambulatory Visit: Payer: Self-pay

## 2023-12-05 ENCOUNTER — Other Ambulatory Visit (HOSPITAL_COMMUNITY): Payer: Self-pay

## 2023-12-05 NOTE — Progress Notes (Signed)
 Specialty Pharmacy Refill Coordination Note  Jody Chen is a 63 y.o. female contacted today regarding refills of specialty medication(s) Risankizumab -rzaa (Skyrizi )   Patient requested Delivery   Delivery date: 12/10/23   Verified address: [address] [phone]   Medication will be filled on 12/09/2023 This fill date is pending response to refill request from provider. Patient is aware and if they have not received fill by intended date they must follow up with pharmacy.

## 2023-12-05 NOTE — Progress Notes (Signed)
 Clinical Intervention Note  Clinical Intervention Notes: Started Ajovy . No DDI with Skyrizi  identified.   Clinical Intervention Outcomes: Prevention of an adverse drug event   Powell CHRISTELLA Gallus Specialty Pharmacist

## 2023-12-05 NOTE — Progress Notes (Signed)
 Specialty Pharmacy Ongoing Clinical Assessment Note  Jody Chen is a 63 y.o. female who is being followed by the specialty pharmacy service for RxSp Crohn's Disease   Patient's specialty medication(s) reviewed today: Risankizumab -rzaa (Skyrizi )   Missed doses in the last 4 weeks: 0   Patient/Caregiver did not have any additional questions or concerns.   Therapeutic benefit summary: Patient is achieving benefit   Adverse events/side effects summary: No adverse events/side effects   Patient's therapy is appropriate to: Continue    Goals Addressed             This Visit's Progress    Maintain optimal adherence to therapy   On track    Patient is on track. Patient will maintain adherence. Patient has not experienced any flares.         Follow up: 1 year  Powell CHRISTELLA Gallus Specialty Pharmacist

## 2023-12-09 ENCOUNTER — Other Ambulatory Visit: Payer: Self-pay

## 2023-12-09 ENCOUNTER — Other Ambulatory Visit: Payer: Self-pay | Admitting: Pharmacist

## 2023-12-09 DIAGNOSIS — Z1211 Encounter for screening for malignant neoplasm of colon: Secondary | ICD-10-CM | POA: Diagnosis not present

## 2023-12-09 DIAGNOSIS — C171 Malignant neoplasm of jejunum: Secondary | ICD-10-CM | POA: Diagnosis not present

## 2023-12-09 DIAGNOSIS — K50818 Crohn's disease of both small and large intestine with other complication: Secondary | ICD-10-CM | POA: Diagnosis not present

## 2023-12-09 MED ORDER — SKYRIZI 360 MG/2.4ML ~~LOC~~ SOCT
SUBCUTANEOUS | 4 refills | Status: AC
  Filled 2023-12-09: qty 2.4, 56d supply, fill #0
  Filled 2024-01-30: qty 2.4, 56d supply, fill #1
  Filled 2024-03-29: qty 2.4, 56d supply, fill #2

## 2023-12-09 MED ORDER — SKYRIZI 360 MG/2.4ML ~~LOC~~ SOCT: SUBCUTANEOUS | 4 refills | Status: DC

## 2023-12-11 ENCOUNTER — Other Ambulatory Visit: Payer: Self-pay

## 2023-12-12 ENCOUNTER — Other Ambulatory Visit (HOSPITAL_COMMUNITY): Payer: Self-pay

## 2023-12-16 ENCOUNTER — Other Ambulatory Visit (HOSPITAL_COMMUNITY): Payer: Self-pay

## 2023-12-16 DIAGNOSIS — E890 Postprocedural hypothyroidism: Secondary | ICD-10-CM | POA: Diagnosis not present

## 2023-12-16 DIAGNOSIS — K909 Intestinal malabsorption, unspecified: Secondary | ICD-10-CM | POA: Diagnosis not present

## 2023-12-16 DIAGNOSIS — I700 Atherosclerosis of aorta: Secondary | ICD-10-CM | POA: Diagnosis not present

## 2023-12-16 DIAGNOSIS — D509 Iron deficiency anemia, unspecified: Secondary | ICD-10-CM | POA: Diagnosis not present

## 2023-12-16 DIAGNOSIS — L659 Nonscarring hair loss, unspecified: Secondary | ICD-10-CM | POA: Diagnosis not present

## 2023-12-16 DIAGNOSIS — K5090 Crohn's disease, unspecified, without complications: Secondary | ICD-10-CM | POA: Diagnosis not present

## 2023-12-16 DIAGNOSIS — C73 Malignant neoplasm of thyroid gland: Secondary | ICD-10-CM | POA: Diagnosis not present

## 2023-12-16 DIAGNOSIS — M810 Age-related osteoporosis without current pathological fracture: Secondary | ICD-10-CM | POA: Diagnosis not present

## 2023-12-18 ENCOUNTER — Other Ambulatory Visit (HOSPITAL_COMMUNITY): Payer: Self-pay

## 2023-12-22 ENCOUNTER — Other Ambulatory Visit (HOSPITAL_COMMUNITY): Payer: Self-pay

## 2023-12-24 ENCOUNTER — Other Ambulatory Visit (HOSPITAL_COMMUNITY): Payer: Self-pay

## 2023-12-25 ENCOUNTER — Other Ambulatory Visit (HOSPITAL_COMMUNITY): Payer: Self-pay

## 2023-12-26 ENCOUNTER — Encounter (HOSPITAL_COMMUNITY): Payer: Self-pay

## 2023-12-26 ENCOUNTER — Other Ambulatory Visit (HOSPITAL_COMMUNITY): Payer: Self-pay

## 2023-12-28 ENCOUNTER — Other Ambulatory Visit (HOSPITAL_COMMUNITY): Payer: Self-pay

## 2023-12-28 MED ORDER — FLUOROURACIL 5 % EX CREA
1.0000 | TOPICAL_CREAM | Freq: Two times a day (BID) | CUTANEOUS | 0 refills | Status: AC
  Filled 2023-12-28: qty 40, 30d supply, fill #0

## 2023-12-29 ENCOUNTER — Other Ambulatory Visit: Payer: Self-pay

## 2024-01-02 ENCOUNTER — Other Ambulatory Visit: Payer: Self-pay

## 2024-01-10 ENCOUNTER — Other Ambulatory Visit (HOSPITAL_COMMUNITY): Payer: Self-pay

## 2024-01-16 ENCOUNTER — Other Ambulatory Visit: Payer: Self-pay

## 2024-01-16 ENCOUNTER — Other Ambulatory Visit (HOSPITAL_COMMUNITY): Payer: Self-pay

## 2024-01-16 MED ORDER — PROGESTERONE MICRONIZED 100 MG PO CAPS
100.0000 mg | ORAL_CAPSULE | Freq: Every day | ORAL | 1 refills | Status: AC
  Filled 2024-01-16: qty 90, 90d supply, fill #0
  Filled 2024-04-16: qty 90, 90d supply, fill #1

## 2024-01-20 ENCOUNTER — Telehealth: Payer: Self-pay | Admitting: Neurology

## 2024-01-20 ENCOUNTER — Encounter: Payer: Self-pay | Admitting: Neurology

## 2024-01-20 NOTE — Telephone Encounter (Signed)
 Dr. Vear, she would need updated visit for change of medication and to have necessary documentation to get approval through insurance. Ok to fit her in? You are WID this afternoon

## 2024-01-20 NOTE — Telephone Encounter (Signed)
 Patient said Fremanezumab -vfrm (AJOVY ) 225 MG/1.5ML SOAJ is not working. Would like to switch to Aimovig. Would like for Aimovig to be called in to [facility]  patient is dr. Ines patient, first and last seen in August. Do not have an appt schedule, have not seen a NP, She's wanting to switch from Ajovy  to Aimovig . do I do schedule with a NP or with work in

## 2024-01-20 NOTE — Telephone Encounter (Signed)
 Patient called in again as she has not heard anything back, states her next dose of Ajovy  is coming up and she does not want to take it since it is not working, would like to start on Aimovig. Previous note was never routed sending to work in for review

## 2024-01-22 ENCOUNTER — Encounter: Payer: Self-pay | Admitting: Neurology

## 2024-01-22 ENCOUNTER — Telehealth: Admitting: Neurology

## 2024-01-22 DIAGNOSIS — G43711 Chronic migraine without aura, intractable, with status migrainosus: Secondary | ICD-10-CM | POA: Diagnosis not present

## 2024-01-22 MED ORDER — SUMATRIPTAN SUCCINATE 100 MG PO TABS: ORAL_TABLET | ORAL | 5 refills | Status: AC

## 2024-01-22 MED ORDER — AIMOVIG 140 MG/ML ~~LOC~~ SOAJ
140.0000 mg | SUBCUTANEOUS | 12 refills | Status: AC
  Filled 2024-02-25: qty 1, 28d supply, fill #0
  Filled 2024-03-19: qty 1, 28d supply, fill #1

## 2024-01-22 NOTE — Progress Notes (Signed)
 [facility]  PATIENT: Jody Chen DOB: 1960/07/01    _________________________________   HISTORICAL  CHIEF COMPLAINT:  Chief Complaint  Patient presents with   Migraine    HISTORY OF PRESENT ILLNESS:  Jody Chen is a 63 year old woman with chronic migraine.  She had previously seen Dr. Ines.  Virtual Visit via Video Note I connected with Jody Chen on 01/22/24 at  3:30 PM EDT by a video enabled telemedicine application and verified that I am speaking with the correct person.  I discussed the limitations of evaluation and management by telemedicine and the availability of in person appointments. The patient expressed understanding and agreed to proceed.  Patient was in her home; provider was in the office    Update 01/22/2024: At the last visit she was placed on Ajovy .   She still has migraine > 15 / month for > 4 hours/day. Some migraines last 5 days.     Unfortunately Ajovy  has not helped much and she is needing sumatriptan  three times a week for migraine.   She is due for her next Ajovy  but wonders if another medication may help more    With her migraines, she notes pulsating/pounding/throbbing pain and light and sound sensitivity, nausea.  She does not have vomiting., she can have unilateral onset or bilateral and can spread to be holocephalic. They are significantly affecting quality of life.  Imitrex  and laying in a dark quiet room helps.  Migraines can be triggered by alcohol, especially wine.  Weather changes can also trigger the migraine.  She does not have a family history of migraine.  There is no medication overuse.    Prophylactic medications tried in the past: Metoprolol (not effective) benefit for headache. Topamax, Depakote, cyclobenzaprine (ineffective)  Acute medications tried: She has some benefit with sumatriptan  though inconsistently.  It usually just helps her for couple hours. No benefit with rizatriptan, Nurtec  REVIEW OF  SYSTEMS: Constitutional: No fevers, chills, sweats, or change in appetite Eyes: No visual changes, double vision, eye pain Ear, nose and throat: No hearing loss, ear pain, nasal congestion, sore throat Cardiovascular: No chest pain, palpitations Respiratory:  No shortness of breath at rest or with exertion.   No wheezes GastrointestinaI: She has Crohn's disease and is on Skyrizi .   Genitourinary:  No dysuria, urinary retention or frequency.  No nocturia. Musculoskeletal:  No neck pain, back pain Integumentary: No rash, pruritus, skin lesions Neurological: as above Psychiatric: No depression at this time.  No anxiety Endocrine: No palpitations, diaphoresis, change in appetite, change in weigh or increased thirst Hematologic/Lymphatic:  No anemia, purpura, petechiae. Allergic/Immunologic: No itchy/runny eyes, nasal congestion, recent allergic reactions, rashes  ALLERGIES: Allergies  Allergen Reactions   Butorphanol    Sulfa Antibiotics     HOME MEDICATIONS:  Current Outpatient Medications:    Erenumab-aooe (AIMOVIG) 140 MG/ML SOAJ, One pen subcu q 4 weeks, Disp: 1.12 mL, Rfl: 12   ALPRAZolam  (XANAX ) 0.5 MG tablet, Take 1 tablet (0.5 mg total) by mouth daily as needed for anxiety, Disp: 60 tablet, Rfl: 0   Azelastine  HCl 137 MCG/SPRAY SOLN, Place 1 spray into both nostrils 2 (two) times daily., Disp: 30 mL, Rfl: 5   denosumab  (PROLIA ) 60 MG/ML SOSY injection, 60mg  Subcutaneous once every 6 months 180 days, Disp: 1 mL, Rfl: 1   escitalopram  (LEXAPRO ) 10 MG tablet, Take 1 tablet (10 mg total) by mouth daily., Disp: 90 tablet, Rfl: 3   estradiol  (ESTRACE ) 1 MG tablet, Take 1  tablet (1 mg total) by mouth daily., Disp: 90 tablet, Rfl: 3   fluorouracil  (EFUDEX ) 5 % cream, Apply 1 Application topically 2 (two) times daily. HOLD FOR SEVERE INFLAMMATION, Disp: 40 g, Rfl: 0   fluorouracil  (EFUDEX ) 5 % cream, Apply 1 Application topically 2 (two) times daily. HOLD FOR SEVERE INFLAMMATION, Disp: 40  g, Rfl: 0   fluticasone  (FLONASE ) 50 MCG/ACT nasal spray, Place 1 spray into both nostrils 2 (two) times daily., Disp: 16 g, Rfl: 5   levocetirizine (XYZAL ) 5 MG tablet, Take 1 tablet by mouth once daily, Disp: 30 tablet, Rfl: 5   levothyroxine  (SYNTHROID ) 200 MCG tablet, Take 1 tablet (200 mcg total) by mouth every morning on an empty stomach., Disp: 30 tablet, Rfl: 11   progesterone  (PROMETRIUM ) 100 MG capsule, Take 1 capsule (100 mg total) by mouth at bedtime., Disp: 90 capsule, Rfl: 1   Risankizumab -rzaa (SKYRIZI ) 360 MG/2.4ML SOCT, Inject 2.4 mL (360 mg total) under the skin every 8 (eight) weeks., Disp: 2.4 mL, Rfl: 4   SUMAtriptan  (IMITREX ) 100 MG tablet, 1 tablet as needed, may take second dose at least 2 hours after first dose up to 2 tablets per day as needed Orally Once a day, Disp: 18 tablet, Rfl: 5   SYNTHROID  200 MCG tablet, Take 1 tablet (200 mcg total) by mouth every morning on an empty stomach, Disp: 30 tablet, Rfl: 3   testosterone  (ANDROGEL ) 50 MG/5GM (1%) GEL, Place 5 g onto the skin daily., Disp: , Rfl:    valACYclovir  (VALTREX ) 500 MG tablet, Take 1 tablet by mouth 2 times daily for 5 days;  starting 1 day prior to procedure., Disp: 10 tablet, Rfl: 0  PAST MEDICAL HISTORY: Past Medical History:  Diagnosis Date   Abdominal distention    Abdominal pain    Cancer (HCC)    thyroid    Constipation    Crohn's    Migraines    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   Thyroid  disease     PAST SURGICAL HISTORY: Past Surgical History:  Procedure Laterality Date   BREAST SURGERY     COLON SURGERY     segmental jejunal small bowe resection      THYROIDECTOMY, PARTIAL  11/06/1992    FAMILY HISTORY: Family History  Problem Relation Age of Onset   Migraines Neg Hx     SOCIAL HISTORY: Social History   Socioeconomic History   Marital status: Married    Spouse name: Not on file   Number of children: Not on file   Years of education: Not on file   Highest education  level: Not on file  Occupational History   Not on file  Tobacco Use   Smoking status: Never   Smokeless tobacco: Never  Vaping Use   Vaping status: Never Used  Substance and Sexual Activity   Alcohol use: Yes    Comment: 1 - 2 glasses weekly   Drug use: No   Sexual activity: Not on file  Other Topics Concern   Not on file  Social History Narrative   Not on file   Social Drivers of Health   Financial Resource Strain: Low Risk  (05/23/2021)   Received from [facility]   Overall Financial Resource Strain (CARDIA)    Difficulty of Paying Living Expenses: Not hard at all  Food Insecurity: Low Risk  (01/30/2023)   Received from [facility]   Hunger Vital Sign    Within the past 12 months, you worried that your food  would run out before you got money to buy more: Never true    Within the past 12 months, the food you bought just didn't last and you didn't have money to get more. : Never true  Transportation Needs: No Transportation Needs (01/30/2023)   Received from [facility]    In the past 12 months, has lack of reliable transportation kept you from medical appointments, meetings, work or from getting things needed for daily living? : No  Physical Activity: Sufficiently Active (05/23/2021)   Received from [facility]   Exercise Vital Sign    On average, how many days per week do you engage in moderate to strenuous exercise (like a brisk walk)?: 4 days    On average, how many minutes do you engage in exercise at this level?: 50 min  Stress: Not on file  Social Connections: Unknown (08/21/2021)   Received from [facility]   Social Network    Social Network: Not on file  Intimate Partner Violence: Unknown (07/13/2021)   Received from [facility]   HITS    Physically Hurt: Not on file    Insult or Talk Down To: Not on file    Threaten Physical Harm: Not on file    Scream or Curse: Not on file       PHYSICAL EXAM  She is a well-developed  well-nourished woman in no acute distress.  The head is normocephalic and atraumatic.  Sclera are anicteric.  Visible skin appears normal.  The neck has a good range of motion.  She is alert and fully oriented with fluent speech and good attention, knowledge and memory.  Extraocular muscles are intact.  Facial strength is normal.   She appears to have normal strength in the arms.  Rapid alternating movements and finger-nose-finger are performed well.     DIAGNOSTIC DATA (LABS, IMAGING, TESTING) - I reviewed patient records, labs, notes, testing and imaging myself where available.  Lab Results  Component Value Date   WBC 7.4 10/07/2022   HGB 14.7 10/07/2022   HCT 42.4 10/07/2022   MCV 97.7 10/07/2022   PLT 316 10/07/2022      Component Value Date/Time   NA 141 10/07/2022 1139   K 4.0 10/07/2022 1139   CL 108 10/07/2022 1139   CO2 27 10/07/2022 1139   GLUCOSE 85 10/07/2022 1139   BUN 11 10/07/2022 1139   CREATININE 0.79 10/07/2022 1139   CREATININE 0.69 02/09/2020 1216   CALCIUM 9.0 10/07/2022 1139   PROT 7.2 10/07/2022 1139   ALBUMIN 3.8 10/07/2022 1139   AST 17 10/07/2022 1139   ALT 12 10/07/2022 1139   ALKPHOS 82 10/07/2022 1139   BILITOT 0.6 10/07/2022 1139   GFRNONAA >60 10/07/2022 1139       ASSESSMENT AND PLAN  Chronic migraine without aura, with intractable migraine, so stated, with status migrainosus    She has not noticed much benefit from [facility].  We discussed other options.  I will send in a prescription for Aimovig 140 mg q 4 weeks.   Renew sumatriptan .   She is going to be out of town for over a week starting next week.  If we are unable to get this preauthorized in time, consider adding Keppra in interim .  Return in 6 months or sooner if there are new or worsening neurologic symptoms.   Follow Up Instructions: I discussed the assessment and treatment plan with the patient. The patient was provided an opportunity to ask  questions and all were answered.  The patient agreed with the plan and demonstrated an understanding of the instructions.    The patient was advised to call back or seek an in-person evaluation if the symptoms worsen or if the condition fails to improve as anticipated.  I provided 20 minutes of non-face-to-face time during this encounter.    Jody Chen A. Vear, MD, [facility] 01/22/2024, 4:03 PM Certified in Neurology, Clinical Neurophysiology, Sleep Medicine and Neuroimaging  [facility] [address], [address] [place], [place] [address] [phone]

## 2024-01-23 ENCOUNTER — Telehealth: Payer: Self-pay | Admitting: Pharmacist

## 2024-01-23 NOTE — Telephone Encounter (Signed)
 Pharmacy Patient Advocate Encounter   Received notification from Patient Pharmacy that prior authorization for Aimovig 140MG /ML auto-injectors is required/requested.   Insurance verification completed.   The patient is insured through [facility].   Per test claim: PA required; PA submitted to above mentioned insurance via Latent Key/confirmation #/EOC  [id number] Status is pending

## 2024-01-26 ENCOUNTER — Telehealth: Admitting: Neurology

## 2024-01-27 NOTE — Telephone Encounter (Signed)
 Pharmacy Patient Advocate Encounter  Received notification from [facility] that Prior Authorization for AIMOVIG 140 MG/ML [place] SOAJ has been APPROVED from 01/23/2024 to 07/21/2024   PA #/Case ID/Reference #: [id number]

## 2024-01-28 ENCOUNTER — Other Ambulatory Visit: Payer: Self-pay

## 2024-01-30 ENCOUNTER — Other Ambulatory Visit: Payer: Self-pay

## 2024-01-30 ENCOUNTER — Other Ambulatory Visit (HOSPITAL_COMMUNITY): Payer: Self-pay

## 2024-01-30 NOTE — Progress Notes (Signed)
 Specialty Pharmacy Refill Coordination Note  Jody Chen is a 63 y.o. female contacted today regarding refills of specialty medication(s) Risankizumab -rzaa (Skyrizi )   Patient requested Delivery   Delivery date: 02/10/24   Verified address: [address]  [place] [address]   Medication will be filled on 02/09/24.

## 2024-02-02 ENCOUNTER — Other Ambulatory Visit: Payer: Self-pay

## 2024-02-02 NOTE — Progress Notes (Signed)
 Clinical Intervention Note  Clinical Intervention Notes: Patient changed to Aimovig for migraines from Ajovy . No DDIs identified with Skyrizi .   Clinical Intervention Outcomes: Prevention of an adverse drug event   Advertising Account Planner

## 2024-02-05 ENCOUNTER — Other Ambulatory Visit (HOSPITAL_COMMUNITY): Payer: Self-pay

## 2024-02-09 ENCOUNTER — Other Ambulatory Visit: Payer: Self-pay

## 2024-02-18 ENCOUNTER — Other Ambulatory Visit: Payer: Self-pay

## 2024-02-20 DIAGNOSIS — M810 Age-related osteoporosis without current pathological fracture: Secondary | ICD-10-CM | POA: Diagnosis not present

## 2024-02-24 ENCOUNTER — Other Ambulatory Visit: Payer: Self-pay

## 2024-02-25 ENCOUNTER — Other Ambulatory Visit (HOSPITAL_COMMUNITY): Payer: Self-pay

## 2024-02-25 ENCOUNTER — Other Ambulatory Visit: Payer: Self-pay

## 2024-02-25 ENCOUNTER — Other Ambulatory Visit (HOSPITAL_BASED_OUTPATIENT_CLINIC_OR_DEPARTMENT_OTHER): Payer: Self-pay

## 2024-02-26 DIAGNOSIS — H10412 Chronic giant papillary conjunctivitis, left eye: Secondary | ICD-10-CM | POA: Diagnosis not present

## 2024-03-02 NOTE — Telephone Encounter (Signed)
 error

## 2024-03-11 ENCOUNTER — Other Ambulatory Visit (HOSPITAL_COMMUNITY): Payer: Self-pay

## 2024-03-11 MED ORDER — VALACYCLOVIR HCL 1 G PO TABS
1.0000 g | ORAL_TABLET | ORAL | 0 refills | Status: AC
  Filled 2024-03-11: qty 30, 30d supply, fill #0

## 2024-03-12 ENCOUNTER — Other Ambulatory Visit: Payer: Self-pay

## 2024-03-19 ENCOUNTER — Other Ambulatory Visit: Payer: Self-pay

## 2024-03-19 ENCOUNTER — Other Ambulatory Visit (HOSPITAL_COMMUNITY): Payer: Self-pay

## 2024-03-23 ENCOUNTER — Other Ambulatory Visit (HOSPITAL_COMMUNITY): Payer: Self-pay

## 2024-03-23 ENCOUNTER — Other Ambulatory Visit: Payer: Self-pay

## 2024-03-23 DIAGNOSIS — M810 Age-related osteoporosis without current pathological fracture: Secondary | ICD-10-CM | POA: Diagnosis not present

## 2024-03-23 MED ORDER — ALPRAZOLAM 0.5 MG PO TABS
0.5000 mg | ORAL_TABLET | Freq: Every day | ORAL | 1 refills | Status: AC | PRN
  Filled 2024-03-23: qty 60, 60d supply, fill #0

## 2024-03-29 ENCOUNTER — Other Ambulatory Visit: Payer: Self-pay

## 2024-03-29 NOTE — Progress Notes (Signed)
 Specialty Pharmacy Refill Coordination Note  Jody Chen is a 63 y.o. female contacted today regarding refills of specialty medication(s) Risankizumab -rzaa (Skyrizi )   Patient requested Delivery   Delivery date: 04/06/24   Verified address: [address] [name]   Medication will be filled on: 04/05/24

## 2024-04-02 ENCOUNTER — Other Ambulatory Visit (HOSPITAL_COMMUNITY): Payer: Self-pay

## 2024-04-05 ENCOUNTER — Other Ambulatory Visit: Payer: Self-pay

## 2024-04-16 ENCOUNTER — Other Ambulatory Visit: Payer: Self-pay

## 2024-04-16 ENCOUNTER — Other Ambulatory Visit (HOSPITAL_COMMUNITY): Payer: Self-pay

## 2024-04-28 ENCOUNTER — Other Ambulatory Visit: Payer: Self-pay

## 2024-04-29 ENCOUNTER — Other Ambulatory Visit: Payer: Self-pay

## 2024-04-29 NOTE — Progress Notes (Signed)
 Disenrolled for Prolia - Refill request denied by MD on 1.21.26 with note medication has been discontinued. 12.16.25 Epic encounter from Dr.Balan shows patient switched to Evenity .

## 2024-05-03 ENCOUNTER — Other Ambulatory Visit: Payer: Self-pay

## 2024-05-03 ENCOUNTER — Ambulatory Visit: Attending: Internal Medicine | Admitting: Pharmacist

## 2024-05-03 DIAGNOSIS — Z79899 Other long term (current) drug therapy: Secondary | ICD-10-CM

## 2024-05-03 NOTE — Progress Notes (Signed)
   S: Patient presents for review of their specialty medication therapy.  Patient is currently taking Skyrizi for [employer]. Patient is managed by Dr. Steele Sizer for this.   Adherence: confirmed.   Efficacy: reports that it's working well for her.   Dosing: 360 mg total subcutaneous once every 8 weeks   Monitoring: S/sx of infection: none S/sx of hypersensitivity/injection site reaction: none  O: Lab Results  Component Value Date   WBC 7.4 10/07/2022   HGB 14.7 10/07/2022   HCT 42.4 10/07/2022   MCV 97.7 10/07/2022   PLT 316 10/07/2022      Chemistry      Component Value Date/Time   NA 141 10/07/2022 1139   K 4.0 10/07/2022 1139   CL 108 10/07/2022 1139   CO2 27 10/07/2022 1139   BUN 11 10/07/2022 1139   CREATININE 0.79 10/07/2022 1139   CREATININE 0.69 02/09/2020 1216      Component Value Date/Time   CALCIUM 9.0 10/07/2022 1139   ALKPHOS 82 10/07/2022 1139   AST 17 10/07/2022 1139   ALT 12 10/07/2022 1139   BILITOT 0.6 10/07/2022 1139       A/P: 1. Medication review: Patient currently on Skyrizi for Crohn's. Reviewed the medication with the patient, including the following: Cristy Folks is a monoclonal antibody used in the treatment of Crohn's. Patient educated on purpose, proper use and potential adverse effects of Skyrizi. Possible adverse effects are infections, headache, and injection site reactions. Live vaccinations should be avoided while on therapy. SubQ: Administer the two consecutive injections subcutaneously at different anatomic locations, such as thighs, abdomen, or back of upper arms. Intended for use under supervision of a health care professional; self-injection may occur after proper training (except back of upper arms). No recommendations for any changes at this time.  Butch Penny, PharmD, Patsy [place], CPP Clinical Pharmacist [facility] [phone]
